# Patient Record
Sex: Male | Born: 1955
Health system: Southern US, Community
[De-identification: ages and names within clinical notes are randomized; demographics above are authoritative.]

## PROBLEM LIST (undated history)

## (undated) DIAGNOSIS — E1149 Type 2 diabetes mellitus with other diabetic neurological complication: Secondary | ICD-10-CM

## (undated) DIAGNOSIS — R002 Palpitations: Secondary | ICD-10-CM

## (undated) DIAGNOSIS — E1142 Type 2 diabetes mellitus with diabetic polyneuropathy: Secondary | ICD-10-CM

## (undated) DIAGNOSIS — I10 Essential (primary) hypertension: Secondary | ICD-10-CM

## (undated) DIAGNOSIS — H4900 Third [oculomotor] nerve palsy, unspecified eye: Secondary | ICD-10-CM

## (undated) HISTORY — DX: Third (oculomotor) nerve palsy, unspecified eye: H49.00

## (undated) HISTORY — PX: OTHER SURGICAL HISTORY: SHX169

## (undated) HISTORY — DX: Type 2 diabetes mellitus with other diabetic neurological complication: E11.49

## (undated) HISTORY — DX: Essential (primary) hypertension: I10

## (undated) HISTORY — DX: Palpitations: R00.2

## (undated) HISTORY — DX: Type 2 diabetes mellitus with diabetic polyneuropathy: E11.42

---

## 1999-04-13 ENCOUNTER — Emergency Department (HOSPITAL_COMMUNITY): Admission: EM | Admit: 1999-04-13 | Discharge: 1999-04-14 | Payer: Self-pay | Admitting: Emergency Medicine

## 1999-04-13 ENCOUNTER — Encounter: Payer: Self-pay | Admitting: Endocrinology

## 2005-11-14 ENCOUNTER — Ambulatory Visit: Payer: Self-pay | Admitting: Internal Medicine

## 2005-11-18 ENCOUNTER — Ambulatory Visit: Payer: Self-pay | Admitting: Internal Medicine

## 2005-11-28 ENCOUNTER — Ambulatory Visit: Payer: Self-pay | Admitting: Internal Medicine

## 2013-02-02 ENCOUNTER — Ambulatory Visit (INDEPENDENT_AMBULATORY_CARE_PROVIDER_SITE_OTHER): Payer: BC Managed Care – PPO | Admitting: Internal Medicine

## 2013-02-02 ENCOUNTER — Encounter: Payer: Self-pay | Admitting: Internal Medicine

## 2013-02-02 VITALS — BP 130/68 | HR 110 | Temp 98.2°F | Wt 171.0 lb

## 2013-02-02 DIAGNOSIS — I10 Essential (primary) hypertension: Secondary | ICD-10-CM

## 2013-02-02 DIAGNOSIS — N400 Enlarged prostate without lower urinary tract symptoms: Secondary | ICD-10-CM | POA: Insufficient documentation

## 2013-02-02 DIAGNOSIS — E1142 Type 2 diabetes mellitus with diabetic polyneuropathy: Secondary | ICD-10-CM

## 2013-02-02 DIAGNOSIS — I152 Hypertension secondary to endocrine disorders: Secondary | ICD-10-CM | POA: Insufficient documentation

## 2013-02-02 DIAGNOSIS — R0989 Other specified symptoms and signs involving the circulatory and respiratory systems: Secondary | ICD-10-CM

## 2013-02-02 DIAGNOSIS — E1149 Type 2 diabetes mellitus with other diabetic neurological complication: Secondary | ICD-10-CM | POA: Insufficient documentation

## 2013-02-02 LAB — CBC WITH DIFFERENTIAL/PLATELET
Basophils Absolute: 0 10*3/uL (ref 0.0–0.1)
Basophils Relative: 0.6 % (ref 0.0–3.0)
Eosinophils Absolute: 0 10*3/uL (ref 0.0–0.7)
Eosinophils Relative: 0.1 % (ref 0.0–5.0)
HCT: 45 % (ref 39.0–52.0)
Hemoglobin: 15.4 g/dL (ref 13.0–17.0)
Lymphocytes Relative: 24.5 % (ref 12.0–46.0)
Lymphs Abs: 2.1 10*3/uL (ref 0.7–4.0)
MCHC: 34.3 g/dL (ref 30.0–36.0)
MCV: 87.4 fl (ref 78.0–100.0)
Monocytes Absolute: 0.5 10*3/uL (ref 0.1–1.0)
Monocytes Relative: 6.3 % (ref 3.0–12.0)
Neutro Abs: 5.9 10*3/uL (ref 1.4–7.7)
Neutrophils Relative %: 68.5 % (ref 43.0–77.0)
Platelets: 306 10*3/uL (ref 150.0–400.0)
RBC: 5.15 Mil/uL (ref 4.22–5.81)
RDW: 11.9 % (ref 11.5–14.6)
WBC: 8.6 10*3/uL (ref 4.5–10.5)

## 2013-02-02 LAB — BASIC METABOLIC PANEL
BUN: 23 mg/dL (ref 6–23)
CO2: 25 mEq/L (ref 19–32)
Calcium: 9.6 mg/dL (ref 8.4–10.5)
Chloride: 95 mEq/L — ABNORMAL LOW (ref 96–112)
Creatinine, Ser: 1.2 mg/dL (ref 0.4–1.5)
GFR: 64.66 mL/min (ref >60.00)
Glucose, Bld: 385 mg/dL — ABNORMAL HIGH (ref 70–99)
Potassium: 4.8 mEq/L (ref 3.5–5.1)
Sodium: 132 mEq/L — ABNORMAL LOW (ref 135–145)

## 2013-02-02 LAB — MICROALBUMIN / CREATININE URINE RATIO
Creatinine,U: 100.8 mg/dL
Microalb, Ur: 21 mg/dL — ABNORMAL HIGH (ref 0.0–1.9)

## 2013-02-02 LAB — HEPATIC FUNCTION PANEL
Alkaline Phosphatase: 97 U/L (ref 39–117)
Bilirubin, Direct: 0.1 mg/dL (ref 0.0–0.3)
Total Bilirubin: 0.6 mg/dL (ref 0.3–1.2)
Total Protein: 7.3 g/dL (ref 6.0–8.3)

## 2013-02-02 LAB — HEMOGLOBIN A1C: Hgb A1c MFr Bld: 12 % — ABNORMAL HIGH (ref 4.6–6.5)

## 2013-02-02 LAB — LIPID PANEL
HDL: 35 mg/dL — ABNORMAL LOW (ref >39.00)
Total CHOL/HDL Ratio: 6
Triglycerides: 556 mg/dL — ABNORMAL HIGH (ref 0.0–149.0)

## 2013-02-02 LAB — PSA: PSA: 0.88 ng/mL (ref 0.10–4.00)

## 2013-02-02 MED ORDER — OLMESARTAN MEDOXOMIL 20 MG PO TABS: 20.0000 mg | ORAL_TABLET | Freq: Every day | ORAL | Status: DC

## 2013-02-02 MED ORDER — PRAVASTATIN SODIUM 40 MG PO TABS: 40.0000 mg | ORAL_TABLET | Freq: Every day | ORAL | Status: DC

## 2013-02-02 MED ORDER — METFORMIN HCL 500 MG PO TABS: 500.0000 mg | ORAL_TABLET | Freq: Two times a day (BID) | ORAL | Status: DC

## 2013-02-02 MED ORDER — INSULIN GLARGINE 100 UNIT/ML ~~LOC~~ SOLN: 30.0000 [IU] | Freq: Every day | SUBCUTANEOUS | Status: DC

## 2013-02-02 MED ORDER — TAMSULOSIN HCL 0.4 MG PO CAPS: 0.4000 mg | ORAL_CAPSULE | Freq: Every day | ORAL | Status: DC

## 2013-02-02 MED ORDER — GLUCOSE BLOOD VI STRP: 1.0000 | ORAL_STRIP | Freq: Every day | Status: DC

## 2013-02-02 NOTE — Progress Notes (Signed)
  Subjective:    Patient ID: Patrick Hernandez, male    DOB: [birth year], 57 y.o.   MRN: [id number]  HPI  57 year old Asian male with type 2 diabetes to establish. Patient previously followed by Dr. Ricki Miller. He has been diabetic for greater than 10 years. He was previously on oral therapy but switched to insulin therapy approximately one year ago. He ran out of Levemir one month ago.  His AM blood sugars range from 130 to 180.  He denies diabetic retinopathy.  He complains of numbness in his feet bilaterally.  His previous PCP mentioned mild proteinuria.  Patient has been on lisinopril 30 mg once daily.  He complains of chronic dry cough.  BP is labile at home.   Review of Systems  Constitutional: Negative for activity change, appetite change and unexpected weight change.  Eyes: Negative for visual disturbance.  Respiratory: Negative for cough, chest tightness and shortness of breath.   Cardiovascular: Negative for chest pain.  Genitourinary:  positive for weak urinary stream.  He denies nocturia Neurological: Negative for headaches.  Gastrointestinal: Negative for abdominal pain Psych: Negative for depression or anxiety Endo:  No polyuria or polydypsia    Past Medical History  Diagnosis Date  . Diabetes mellitus without complication   . Hypertension     History   Social History  . Marital Status: Married    Spouse Name: N/A    Number of Children: N/A  . Years of Education: N/A   Occupational History  . Self employed    Social History Main Topics  . Smoking status: Never Smoker   . Smokeless tobacco: Not on file  . Alcohol Use: No  . Drug Use: No  . Sexually Active: Not on file   Other Topics Concern  . Not on file   Social History Narrative   Married   Self employed - dry cleaning business    History reviewed. No pertinent past surgical history.  Family History  Problem Relation Age of Onset  . Diabetes Mother   . Diabetes Father     Allergies not on file  No  current outpatient prescriptions on file prior to visit.   No current facility-administered medications on file prior to visit.    BP 130/68  Pulse 110  Temp(Src) 98.2 F (36.8 C) (Oral)  Wt 171 lb (77.565 kg)  SpO2 98%        Objective:   Physical Exam  Constitutional: He is oriented to person, place, and time. He appears well-developed and well-nourished.  HENT:  Head: Normocephalic and atraumatic.  Right Ear: External ear normal.  Left Ear: External ear normal.  Mouth/Throat: Oropharynx is clear and moist.  Eyes: Conjunctivae and EOM are normal. Pupils are equal, round, and reactive to light.  Neck: Normal range of motion. Neck supple.  Cardiovascular: Normal rate and regular rhythm.   No murmur heard. Pulmonary/Chest: Effort normal and breath sounds normal. He has no wheezes.  Abdominal: Soft. Bowel sounds are normal. He exhibits no mass. There is no tenderness.  Genitourinary: Guaiac negative stool.  Enlarged prostate without nodules or asymmetry, no rectal mass  Lymphadenopathy:    He has no cervical adenopathy.  Neurological: He is alert and oriented to person, place, and time. No cranial nerve deficit.  Skin: Skin is warm and dry.  Psychiatric: He has a normal mood and affect. His behavior is normal.          Assessment & Plan:

## 2013-02-02 NOTE — Assessment & Plan Note (Signed)
Start tamsulosin for BPH symptoms.

## 2013-02-02 NOTE — Assessment & Plan Note (Addendum)
Patient has uncontrolled diabetes.  Switch levemir to lantus.  Use 30 units at bedtime.  Also restart metformin 500 mg twice daily.  Stressed importance of BP control.  Start pravastatin to reduce CV risk.   Check A1c, microalb/cr ratio.  Patient has symptoms of diabetic polyneuropathy.  He has decreased sensation to temperature and vibration in his lower extremities.

## 2013-02-02 NOTE — Assessment & Plan Note (Signed)
Lisinopril causing cough.  Switch to ARB.  Patient encouraged to monitor BP at home. BP: 130/68 mmHg

## 2013-02-02 NOTE — Assessment & Plan Note (Signed)
Patient has diminished pulses in his feet.  Arrange ABI.  Screen for PAD.

## 2013-02-03 ENCOUNTER — Other Ambulatory Visit: Payer: Self-pay | Admitting: Internal Medicine

## 2013-02-03 DIAGNOSIS — R0989 Other specified symptoms and signs involving the circulatory and respiratory systems: Secondary | ICD-10-CM

## 2013-02-04 ENCOUNTER — Telehealth: Payer: Self-pay | Admitting: Internal Medicine

## 2013-02-04 NOTE — Telephone Encounter (Signed)
Ok to switch to bayer (contour).

## 2013-02-04 NOTE — Telephone Encounter (Signed)
Will give glucometer to pt when he comes in for 2 wk f/u.  Pt's daughter also wanted Dr Artist Pais to know that he has been unable to afford his medicines but now he has insurance and will resume

## 2013-02-04 NOTE — Telephone Encounter (Signed)
[employer] does not cover FREESTYLE LITE test strips. Preferred strips are: Child psychotherapist) or Life Scan (One Touch).  Will only cover Freestyle if patient has: insulin pump, visual impairment, or physical/mental disability that prevent him from using one of the preferred. Please advise. Pt uses [facility] on [address].

## 2013-02-08 ENCOUNTER — Encounter: Payer: Self-pay | Admitting: Internal Medicine

## 2013-02-15 ENCOUNTER — Encounter (INDEPENDENT_AMBULATORY_CARE_PROVIDER_SITE_OTHER): Payer: BC Managed Care – PPO

## 2013-02-15 DIAGNOSIS — R0989 Other specified symptoms and signs involving the circulatory and respiratory systems: Secondary | ICD-10-CM

## 2013-02-15 DIAGNOSIS — I739 Peripheral vascular disease, unspecified: Secondary | ICD-10-CM

## 2013-02-18 ENCOUNTER — Encounter: Payer: Self-pay | Admitting: Internal Medicine

## 2013-02-18 ENCOUNTER — Ambulatory Visit (INDEPENDENT_AMBULATORY_CARE_PROVIDER_SITE_OTHER): Payer: BC Managed Care – PPO | Admitting: Internal Medicine

## 2013-02-18 VITALS — BP 142/82 | HR 106 | Temp 98.2°F | Wt 174.0 lb

## 2013-02-18 DIAGNOSIS — I10 Essential (primary) hypertension: Secondary | ICD-10-CM

## 2013-02-18 DIAGNOSIS — N400 Enlarged prostate without lower urinary tract symptoms: Secondary | ICD-10-CM

## 2013-02-18 DIAGNOSIS — E1149 Type 2 diabetes mellitus with other diabetic neurological complication: Secondary | ICD-10-CM

## 2013-02-18 DIAGNOSIS — R0989 Other specified symptoms and signs involving the circulatory and respiratory systems: Secondary | ICD-10-CM

## 2013-02-18 DIAGNOSIS — E1142 Type 2 diabetes mellitus with diabetic polyneuropathy: Secondary | ICD-10-CM

## 2013-02-18 MED ORDER — METOPROLOL SUCCINATE ER 25 MG PO TB24: 25.0000 mg | ORAL_TABLET | Freq: Every day | ORAL | Status: DC

## 2013-02-18 MED ORDER — GLUCOSE BLOOD VI STRP: 1.0000 | ORAL_STRIP | Freq: Every day | Status: DC

## 2013-02-18 MED ORDER — INSULIN LISPRO 100 UNIT/ML ~~LOC~~ SOLN: SUBCUTANEOUS | Status: DC

## 2013-02-18 NOTE — Patient Instructions (Signed)
Continue to check morning blood sugars.  Also check blood sugar 2 hrs after meals.

## 2013-02-18 NOTE — Assessment & Plan Note (Signed)
Add mealtime insulin-Humalog) 5-10 units 3 times daily. Continue same dose of Lantus.  Reassess in 3 weeks. Refer to ophthalmologist for diabetic eye exam. Lab Results  Component Value Date   HGBA1C 12.0* 02/02/2013

## 2013-02-18 NOTE — Assessment & Plan Note (Signed)
Improved.  Home blood pressure readings reviewed. Systolic blood pressure frequently greater than 140. His heart rate also elevated. Add metoprolol XL 25 mg once daily to Benicar 20 mg.

## 2013-02-18 NOTE — Assessment & Plan Note (Signed)
Lower extremity ABI showed posterior tibial disease bilaterally. Continue medical therapy.

## 2013-02-18 NOTE — Assessment & Plan Note (Signed)
Improved with flomax 

## 2013-02-18 NOTE — Progress Notes (Signed)
  Subjective:    Patient ID: Patrick Hernandez, male    DOB: 11/23/1955, 57 y.o.   MRN: [id number]  HPI  57 year old Asian male with uncontrolled type 2 diabetes for followup. Patient ran out of Levemir one month ago. Patient restarted on Lantus 30 units once daily. Patient also restarted on metformin. His morning blood sugars are still in the 200s.  His postprandial blood sugars are greater than 300.  Patient completed lower extremity ABIs. He has disease of his posterior tibial system.     Review of Systems Negative for chest pain It has been over a year since his last diabetic eye exam  Past Medical History  Diagnosis Date  . Diabetes mellitus without complication   . Hypertension     History   Social History  . Marital Status: Married    Spouse Name: N/A    Number of Children: N/A  . Years of Education: N/A   Occupational History  . Self employed    Social History Main Topics  . Smoking status: Never Smoker   . Smokeless tobacco: Not on file  . Alcohol Use: No  . Drug Use: No  . Sexually Active: Not on file   Other Topics Concern  . Not on file   Social History Narrative   Married   Self employed - dry cleaning business    No past surgical history on file.  Family History  Problem Relation Age of Onset  . Diabetes Mother   . Diabetes Father     Not on File  Current Outpatient Prescriptions on File Prior to Visit  Medication Sig Dispense Refill  . aspirin 81 MG tablet Take 81 mg by mouth daily.      . insulin glargine (LANTUS SOLOSTAR) 100 UNIT/ML injection Inject 0.3 mLs (30 Units total) into the skin at bedtime.  5 pen  5  . metFORMIN (GLUCOPHAGE) 500 MG tablet Take 1 tablet (500 mg total) by mouth 2 (two) times daily with a meal.  180 tablet  1  . olmesartan (BENICAR) 20 MG tablet Take 1 tablet (20 mg total) by mouth daily.  90 tablet  1  . pravastatin (PRAVACHOL) 40 MG tablet Take 1 tablet (40 mg total) by mouth daily.  90 tablet  1  . tamsulosin  (FLOMAX) 0.4 MG CAPS Take 1 capsule (0.4 mg total) by mouth daily after supper.  90 capsule  1   No current facility-administered medications on file prior to visit.    BP 142/82  Pulse 106  Temp(Src) 98.2 F (36.8 C) (Oral)  Wt 174 lb (78.926 kg)       Objective:   Physical Exam  Constitutional: He is oriented to person, place, and time. He appears well-developed and well-nourished.  Cardiovascular: Normal rate, regular rhythm and normal heart sounds.   Pulmonary/Chest: Effort normal and breath sounds normal. He has no wheezes.  Neurological: He is alert and oriented to person, place, and time. No cranial nerve deficit.  Psychiatric: He has a normal mood and affect. His behavior is normal.          Assessment & Plan:

## 2013-02-24 ENCOUNTER — Encounter: Payer: Self-pay | Admitting: Internal Medicine

## 2013-03-02 ENCOUNTER — Ambulatory Visit: Payer: BC Managed Care – PPO | Admitting: Internal Medicine

## 2013-03-16 ENCOUNTER — Telehealth: Payer: Self-pay | Admitting: Internal Medicine

## 2013-03-16 NOTE — Telephone Encounter (Signed)
PT daughter called and stated that when the PT went to pick up his olmesartan (BENICAR) 20 MG tablet, he was informed that his insurance no longer covers that medication. She is inquiring to see if there is anything cheaper that his pharmacy would cover. She also stated that her dad is checking his sugar at least 3 times a day, and his RX for his needles only states once a day. Therefor he is out of his needles and will need another RX with instructions stating that he will be using them more frequent than once a day. Please assist.

## 2013-03-17 MED ORDER — VALSARTAN 160 MG PO TABS: 160.0000 mg | ORAL_TABLET | Freq: Every day | ORAL | Status: DC

## 2013-03-17 MED ORDER — INSULIN PEN NEEDLE 32G X 4 MM MISC: 1.0000 | Freq: Four times a day (QID) | Status: DC

## 2013-03-17 NOTE — Telephone Encounter (Signed)
Please change benicar 20 mg to Diovan 160 mg once daily.  # 90 RF x 1.  Also send in ped needles.  # 120 use 4 x daily as directed  RF x 11

## 2013-03-17 NOTE — Telephone Encounter (Signed)
rx's sent in electronically, pts daughter aware

## 2013-03-22 ENCOUNTER — Telehealth: Payer: Self-pay | Admitting: Internal Medicine

## 2013-03-22 MED ORDER — LOSARTAN POTASSIUM 100 MG PO TABS: 100.0000 mg | ORAL_TABLET | Freq: Every day | ORAL | Status: DC

## 2013-03-22 MED ORDER — GLUCOSE BLOOD VI STRP: 1.0000 | ORAL_STRIP | Freq: Three times a day (TID) | Status: DC

## 2013-03-22 NOTE — Telephone Encounter (Signed)
Due to change in pt's testing pt needs new script for : glucose blood (BAYER CONTOUR NEXT TEST) test strip Pharm/ [facility]/[address]

## 2013-03-22 NOTE — Telephone Encounter (Signed)
Dr Artist Pais - Benicar and Diovan both denied by insurance. They want him to start out on the lowest med in this class first, like losartan. Is this appropriate? If so, please send to [facility].

## 2013-03-22 NOTE — Telephone Encounter (Signed)
rx's sent in electronically 

## 2013-03-22 NOTE — Telephone Encounter (Signed)
Ok to change to losartan 100 mg # 90 one po qd.  RF x 1

## 2013-05-02 ENCOUNTER — Other Ambulatory Visit: Payer: Self-pay | Admitting: Internal Medicine

## 2013-05-17 ENCOUNTER — Encounter: Payer: Self-pay | Admitting: Internal Medicine

## 2013-07-04 ENCOUNTER — Other Ambulatory Visit: Payer: Self-pay | Admitting: Internal Medicine

## 2013-07-22 ENCOUNTER — Other Ambulatory Visit: Payer: Self-pay | Admitting: Internal Medicine

## 2013-08-06 ENCOUNTER — Other Ambulatory Visit: Payer: Self-pay | Admitting: Internal Medicine

## 2013-08-19 ENCOUNTER — Other Ambulatory Visit: Payer: Self-pay | Admitting: Internal Medicine

## 2013-09-24 ENCOUNTER — Other Ambulatory Visit: Payer: Self-pay | Admitting: Internal Medicine

## 2013-10-12 ENCOUNTER — Ambulatory Visit (INDEPENDENT_AMBULATORY_CARE_PROVIDER_SITE_OTHER): Payer: BC Managed Care – PPO | Admitting: Internal Medicine

## 2013-10-12 ENCOUNTER — Encounter: Payer: Self-pay | Admitting: Internal Medicine

## 2013-10-12 VITALS — BP 170/92 | HR 80 | Temp 98.2°F | Ht 69.75 in | Wt 198.0 lb

## 2013-10-12 DIAGNOSIS — R002 Palpitations: Secondary | ICD-10-CM

## 2013-10-12 DIAGNOSIS — E1149 Type 2 diabetes mellitus with other diabetic neurological complication: Secondary | ICD-10-CM

## 2013-10-12 DIAGNOSIS — I10 Essential (primary) hypertension: Secondary | ICD-10-CM

## 2013-10-12 DIAGNOSIS — E1142 Type 2 diabetes mellitus with diabetic polyneuropathy: Secondary | ICD-10-CM

## 2013-10-12 DIAGNOSIS — R079 Chest pain, unspecified: Secondary | ICD-10-CM

## 2013-10-12 MED ORDER — ATORVASTATIN CALCIUM 40 MG PO TABS: 40.0000 mg | ORAL_TABLET | Freq: Every day | ORAL | Status: DC

## 2013-10-12 MED ORDER — INSULIN LISPRO 100 UNIT/ML ~~LOC~~ SOLN: SUBCUTANEOUS | Status: DC

## 2013-10-12 MED ORDER — AMLODIPINE BESYLATE 5 MG PO TABS: 5.0000 mg | ORAL_TABLET | Freq: Every day | ORAL | Status: DC

## 2013-10-12 MED ORDER — METOPROLOL SUCCINATE ER 50 MG PO TB24: 50.0000 mg | ORAL_TABLET | Freq: Every day | ORAL | Status: DC

## 2013-10-12 NOTE — Assessment & Plan Note (Signed)
Patient experiencing hypoglycemic episodes. Reduce mealtime insulin 8 units before meal. Continue same dose of metformin. Monitor A1c.

## 2013-10-12 NOTE — Patient Instructions (Signed)
Our office will contact you re: cardiology referral Go to the emergency room if your chest pain gets worse.

## 2013-10-12 NOTE — Progress Notes (Signed)
Subjective:    Patient ID: Patrick Hernandez, male    DOB: [birth year], 57 y.o.   MRN: [id number]  HPI  57 year old [place] male with uncontrolled type 2 diabetes and hypertension for followup. Patient reports over last 2 months patient has experienced intermittent palpitations and mild substernal chest discomfort. Patient also reports associated mild dizziness and shortness of breath with palpitations. Symptoms only last several minutes. He has been compliant with his blood pressure medications. He takes losartan 100 mg once daily and Toprol 25 mg once daily. Patient works as a Retail banker (dry cleaning business). He denies any exertional chest pain.  No family history of coronary artery disease.  Uncontrolled type 2 diabetes-he is taking Lantus 30 units in the morning and 15 units before breakfast and dinner. He has intermittent hypoglycemic episodes especially in the afternoon.  Hyperlipidemia-good compliance with pravastatin   Review of Systems Palpitations/skipped beats, mild chest discomfort, no radiation of discomfort, Negative for exercise intolerance    Past Medical History  Diagnosis Date  . Diabetes mellitus without complication   . Hypertension     History   Social History  . Marital Status: Married    Spouse Name: N/A    Number of Children: N/A  . Years of Education: N/A   Occupational History  . Self employed    Social History Main Topics  . Smoking status: Never Smoker   . Smokeless tobacco: Not on file  . Alcohol Use: No  . Drug Use: No  . Sexual Activity: Not on file   Other Topics Concern  . Not on file   Social History Narrative   Married   Self employed - dry cleaning business    No past surgical history on file.  Family History  Problem Relation Age of Onset  . Diabetes Mother   . Diabetes Father     Not on File  Current Outpatient Prescriptions on File Prior to Visit  Medication Sig Dispense Refill  . aspirin 81 MG tablet Take 81  mg by mouth daily.      Marland Kitchen BAYER CONTOUR NEXT TEST test strip 1 EACH BY OTHER ROUTE 3 (THREE) TIMES DAILY. USE AS INSTRUCTED  100 each  2  . insulin glargine (LANTUS SOLOSTAR) 100 UNIT/ML injection Inject 0.3 mLs (30 Units total) into the skin at bedtime.  5 pen  5  . insulin lispro (HUMALOG KWIKPEN) 100 UNIT/ML injection Use 5 to 10 units 15 minutes before meals 3 times daily  15 mL  5  . Insulin Pen Needle (BD PEN NEEDLE NANO U/F) 32G X 4 MM MISC 1 each by Does not apply route 4 (four) times daily.  150 each  11  . losartan (COZAAR) 100 MG tablet TAKE 1 TABLET (100 MG TOTAL) BY MOUTH DAILY.  90 tablet  0  . metFORMIN (GLUCOPHAGE) 500 MG tablet TAKE 1 TABLET (500 MG TOTAL) BY MOUTH 2 TIMES DAILY WITH A MEAL.  60 tablet  0  . metoprolol succinate (TOPROL-XL) 25 MG 24 hr tablet TAKE 1 TABLET (25 MG TOTAL) BY MOUTH DAILY.  30 tablet  0  . metoprolol succinate (TOPROL-XL) 25 MG 24 hr tablet TAKE 1 TABLET (25 MG TOTAL) BY MOUTH DAILY.  30 tablet  0  . pravastatin (PRAVACHOL) 40 MG tablet TAKE 1 TABLET (40 MG TOTAL) BY MOUTH DAILY.  90 tablet  1  . tamsulosin (FLOMAX) 0.4 MG CAPS capsule TAKE 1 CAPSULE (0.4 MG TOTAL) BY MOUTH DAILY AFTER SUPPER.  90 capsule  1   No current facility-administered medications on file prior to visit.    BP 170/92  Pulse 80  Temp(Src) 98.2 F (36.8 C) (Oral)  Ht 5' 9.75" (1.772 m)  Wt 198 lb (89.812 kg)  BMI 28.60 kg/m2  EKG shows normal sinus rhythm at 82 beats per minute. No ST changes noted  Objective:   Physical Exam  Constitutional: He is oriented to person, place, and time. He appears well-developed and well-nourished. No distress.  HENT:  Head: Normocephalic and atraumatic.  Right Ear: External ear normal.  Left Ear: External ear normal.  Eyes: EOM are normal. Pupils are equal, round, and reactive to light.  Neck: Neck supple.  No carotid bruit  Cardiovascular: Normal rate, regular rhythm, normal heart sounds and intact distal pulses.   No murmur  heard. Pulmonary/Chest: Effort normal and breath sounds normal. He has no wheezes. He has no rales.  Musculoskeletal: He exhibits no edema.  Lymphadenopathy:    He has no cervical adenopathy.  Neurological: He is alert and oriented to person, place, and time.  Skin: Skin is warm and dry.  Psychiatric: He has a normal mood and affect. His behavior is normal.          Assessment & Plan:

## 2013-10-12 NOTE — Assessment & Plan Note (Signed)
Blood pressure is poorly controlled. Continue same dose of losartan 100 mg. Increase metoprolol succinate 250 mg once daily and add amlodipine 5 mg. BP: 170/92 mmHg

## 2013-10-12 NOTE — Progress Notes (Signed)
Pre visit review using our clinic review tool, if applicable. No additional management support is needed unless otherwise documented below in the visit note. 

## 2013-10-12 NOTE — Assessment & Plan Note (Addendum)
57 year old [place] male with intermittent palpitations and chest discomfort. Patient has multiple risk factors for coronary disease. EKG today is normal. Refer to cardiology for further testing. Increase metoprolol xl to 50 mg.  Add amlodipine 5 mg to losartan 100 mg.  Discontinue pravastatin. Switch to atorvastatin 40 mg once daily.  Obtain BMET, CBCD, and thyroid studies.

## 2013-10-13 LAB — BASIC METABOLIC PANEL
CO2: 18 mEq/L — ABNORMAL LOW (ref 19–32)
Calcium: 9 mg/dL (ref 8.4–10.5)
Chloride: 106 mEq/L (ref 96–112)
Sodium: 138 mEq/L (ref 135–145)

## 2013-10-13 LAB — CBC WITH DIFFERENTIAL/PLATELET
Basophils Absolute: 0 10*3/uL (ref 0.0–0.1)
Eosinophils Relative: 0.5 % (ref 0.0–5.0)
Hemoglobin: 12.9 g/dL — ABNORMAL LOW (ref 13.0–17.0)
Lymphocytes Relative: 19.9 % (ref 12.0–46.0)
Monocytes Relative: 5.4 % (ref 3.0–12.0)
Neutro Abs: 6.9 10*3/uL (ref 1.4–7.7)
Neutrophils Relative %: 73.9 % (ref 43.0–77.0)
Platelets: 276 10*3/uL (ref 150.0–400.0)
RBC: 4.17 Mil/uL — ABNORMAL LOW (ref 4.22–5.81)
RDW: 11.8 % (ref 11.5–14.6)

## 2013-10-13 LAB — T4, FREE: Free T4: 0.88 ng/dL (ref 0.60–1.60)

## 2013-10-13 LAB — HEMOGLOBIN A1C: Hgb A1c MFr Bld: 7.9 % — ABNORMAL HIGH (ref 4.6–6.5)

## 2013-10-13 LAB — TSH: TSH: 1.31 u[IU]/mL (ref 0.35–5.50)

## 2013-10-14 ENCOUNTER — Other Ambulatory Visit: Payer: Self-pay | Admitting: Internal Medicine

## 2013-10-18 ENCOUNTER — Other Ambulatory Visit (INDEPENDENT_AMBULATORY_CARE_PROVIDER_SITE_OTHER): Payer: BC Managed Care – PPO

## 2013-10-18 ENCOUNTER — Other Ambulatory Visit: Payer: Self-pay | Admitting: *Deleted

## 2013-10-18 DIAGNOSIS — D649 Anemia, unspecified: Secondary | ICD-10-CM

## 2013-10-18 DIAGNOSIS — N289 Disorder of kidney and ureter, unspecified: Secondary | ICD-10-CM

## 2013-10-18 LAB — IRON: Iron: 58 ug/dL (ref 42–165)

## 2013-10-18 LAB — IBC PANEL
Iron: 58 ug/dL (ref 42–165)
Saturation Ratios: 21.9 % (ref 20.0–50.0)
Transferrin: 189.6 mg/dL — ABNORMAL LOW (ref 212.0–360.0)

## 2013-10-18 LAB — VITAMIN B12: Vitamin B-12: 348 pg/mL (ref 211–911)

## 2013-10-18 LAB — FERRITIN: Ferritin: 151.5 ng/mL (ref 22.0–322.0)

## 2013-10-18 NOTE — Addendum Note (Signed)
Addended by: Rita Ohara R on: 10/18/2013 04:03 PM   Modules accepted: Orders

## 2013-10-19 ENCOUNTER — Other Ambulatory Visit: Payer: Self-pay | Admitting: *Deleted

## 2013-10-19 ENCOUNTER — Ambulatory Visit
Admission: RE | Admit: 2013-10-19 | Discharge: 2013-10-19 | Disposition: A | Discharge status: Home / Self Care | Payer: BC Managed Care – PPO | Source: Ambulatory Visit | Attending: Internal Medicine | Admitting: Internal Medicine

## 2013-10-19 DIAGNOSIS — N289 Disorder of kidney and ureter, unspecified: Secondary | ICD-10-CM

## 2013-10-19 LAB — URINALYSIS, MICROSCOPIC ONLY

## 2013-10-21 ENCOUNTER — Other Ambulatory Visit: Payer: BC Managed Care – PPO

## 2013-10-21 ENCOUNTER — Encounter: Payer: Self-pay | Admitting: Cardiology

## 2013-10-21 ENCOUNTER — Ambulatory Visit (INDEPENDENT_AMBULATORY_CARE_PROVIDER_SITE_OTHER): Payer: BC Managed Care – PPO | Admitting: Cardiology

## 2013-10-21 DIAGNOSIS — R002 Palpitations: Secondary | ICD-10-CM

## 2013-10-21 DIAGNOSIS — R079 Chest pain, unspecified: Secondary | ICD-10-CM

## 2013-10-21 NOTE — Patient Instructions (Signed)
The current medical regimen is effective;  continue present plan and medications.  Your physician has recommended that you wear a holter monitor for 48 hours.This will be placed the same day as your treadmill test.  Holter monitors are medical devices that record the heart's electrical activity. Doctors most often use these monitors to diagnose arrhythmias. Arrhythmias are problems with the speed or rhythm of the heartbeat. The monitor is a small, portable device. You can wear one while you do your normal daily activities. This is usually used to diagnose what is causing palpitations/syncope (passing out).  Your physician has requested that you have an exercise tolerance test. For further information please visit [email or web address] Please also follow instruction sheet, as given.  Follow up with Dr Antoine Poche after testing.

## 2013-10-21 NOTE — Progress Notes (Signed)
HPI The patient presents for evaluation of chest discomfort and abnormal heartbeat. The history is obtained through his daughter who interpreted [place] on the telephone for [place].   He has no prior cardiac history. He says for about 6 months he's been getting some episodes of his heart beating slower. He might have a vague chest discomfort with this. He says sometimes it bleeds a little fast to. He doesn't pass out but he might get a little weak. This apparently happens sporadically and at rest. He cannot bring it on with activities. He doesn't exercise but he does work physical job and with this he does not bring on any symptoms he does not describe discomfort in his jaw or his arms. He does not describe any excessive shortness of breath, PND or orthopnea. He has had no weight gain or edema. Of note he has had long-standing diabetes poorly controlled until recently.  No Known Allergies  Current Outpatient Prescriptions  Medication Sig Dispense Refill  . amLODipine (NORVASC) 5 MG tablet Take 1 tablet (5 mg total) by mouth daily.  30 tablet  1  . aspirin 81 MG tablet Take 81 mg by mouth daily.      Marland Kitchen atorvastatin (LIPITOR) 40 MG tablet Take 1 tablet (40 mg total) by mouth daily.  90 tablet  1  . BAYER CONTOUR NEXT TEST test strip 1 EACH BY OTHER ROUTE 3 (THREE) TIMES DAILY. USE AS INSTRUCTED  100 each  2  . insulin glargine (LANTUS SOLOSTAR) 100 UNIT/ML injection Inject 0.3 mLs (30 Units total) into the skin at bedtime.  5 pen  5  . insulin lispro (HUMALOG) 100 UNIT/ML injection Use 8 units before breakfast and dinner  15 mL  5  . Insulin Pen Needle (BD PEN NEEDLE NANO U/F) 32G X 4 MM MISC 1 each by Does not apply route 4 (four) times daily.  150 each  11  . losartan (COZAAR) 100 MG tablet TAKE 1 TABLET (100 MG TOTAL) BY MOUTH DAILY.  90 tablet  0  . metFORMIN (GLUCOPHAGE) 500 MG tablet TAKE 1 TABLET (500 MG TOTAL) BY MOUTH 2 TIMES DAILY WITH A MEAL.  60 tablet  0  . metoprolol succinate (TOPROL-XL)  50 MG 24 hr tablet Take 1 tablet (50 mg total) by mouth daily.  90 tablet  1  . tamsulosin (FLOMAX) 0.4 MG CAPS capsule TAKE 1 CAPSULE (0.4 MG TOTAL) BY MOUTH DAILY AFTER SUPPER.  90 capsule  1   No current facility-administered medications for this visit.    Past Medical History  Diagnosis Date  . Diabetes mellitus without complication   . Hypertension   . Chest pain, unspecified   . Palpitations   . Type II or unspecified type diabetes mellitus with neurological manifestations, not stated as uncontrolled(250.60)   . Polyneuropathy in diabetes(357.2)   . Unspecified essential hypertension     No past surgical history on file.  Family History  Problem Relation Age of Onset  . Diabetes Mother   . Diabetes Father     History   Social History  . Marital Status: Married    Spouse Name: N/A    Number of Children: N/A  . Years of Education: N/A   Occupational History  . Self employed    Social History Main Topics  . Smoking status: Never Smoker   . Smokeless tobacco: Not on file  . Alcohol Use: No  . Drug Use: No  . Sexual Activity: Not on file  Other Topics Concern  . Not on file   Social History Narrative   Married   Self employed - dry cleaning business    ROS:  As stated in the HPI and negative for all other systems.   PHYSICAL EXAM There were no vitals taken for this visit. GENERAL:  Well appearing HEENT:  Pupils equal round and reactive, fundi not visualized, oral mucosa unremarkable NECK:  No jugular venous distention, waveform within normal limits, carotid upstroke brisk and symmetric, no bruits, no thyromegaly LYMPHATICS:  No cervical, inguinal adenopathy LUNGS:  Clear to auscultation bilaterally BACK:  No CVA tenderness CHEST:  Unremarkable HEART:  PMI not displaced or sustained,S1 and S2 within normal limits, no S3, no S4, no clicks, no rubs, no murmurs ABD:  Flat, positive bowel sounds normal in frequency in pitch, no bruits, no rebound, no  guarding, no midline pulsatile mass, no hepatomegaly, no splenomegaly EXT:  2 plus pulses throughout, no edema, no cyanosis no clubbing SKIN:  No rashes no nodules NEURO:  Cranial nerves II through XII grossly intact, motor grossly intact throughout PSYCH:  Cognitively intact, oriented to person place and time  EKG:  Sinus rhythm, rate 82, axis within normal limits, intervals within normal limits, no acute ST-T wave changes.   ASSESSMENT AND PLAN  CHEST PAIN:  The pain  He is vague and somewhat atypical. However, he has significant risk factors with his long-standing diabetes. I will bring the patient back for a POET (Plain Old Exercise Test). This will allow me to screen for obstructive coronary disease, risk stratify and very importantly provide a prescription for exercise.   I did talk with him about the need to let me know if he has continued symptoms even if this is normal as I would have a low threshold for further testing.  PALPITATIONS:   I will start with 48 hour monitor to further evaluate this.

## 2013-10-24 ENCOUNTER — Other Ambulatory Visit: Payer: BC Managed Care – PPO

## 2013-10-24 LAB — FECAL OCCULT BLOOD, IMMUNOCHEMICAL: Fecal Occult Bld: NEGATIVE

## 2013-10-25 ENCOUNTER — Other Ambulatory Visit (INDEPENDENT_AMBULATORY_CARE_PROVIDER_SITE_OTHER): Payer: BC Managed Care – PPO

## 2013-10-25 DIAGNOSIS — N289 Disorder of kidney and ureter, unspecified: Secondary | ICD-10-CM

## 2013-10-26 LAB — BASIC METABOLIC PANEL
BUN: 25 mg/dL — ABNORMAL HIGH (ref 6–23)
Calcium: 8.7 mg/dL (ref 8.4–10.5)
Creatinine, Ser: 1.6 mg/dL — ABNORMAL HIGH (ref 0.4–1.5)
GFR: 49.02 mL/min — ABNORMAL LOW (ref >60.00)
Glucose, Bld: 108 mg/dL — ABNORMAL HIGH (ref 70–99)

## 2013-10-28 ENCOUNTER — Other Ambulatory Visit: Payer: Self-pay | Admitting: Internal Medicine

## 2013-10-28 DIAGNOSIS — I10 Essential (primary) hypertension: Secondary | ICD-10-CM

## 2013-11-02 ENCOUNTER — Encounter: Payer: Self-pay | Admitting: Cardiology

## 2013-11-02 ENCOUNTER — Ambulatory Visit (HOSPITAL_COMMUNITY): Payer: BC Managed Care – PPO | Attending: Internal Medicine

## 2013-11-02 DIAGNOSIS — I10 Essential (primary) hypertension: Secondary | ICD-10-CM | POA: Insufficient documentation

## 2013-11-02 DIAGNOSIS — E119 Type 2 diabetes mellitus without complications: Secondary | ICD-10-CM | POA: Insufficient documentation

## 2013-11-02 DIAGNOSIS — E785 Hyperlipidemia, unspecified: Secondary | ICD-10-CM | POA: Insufficient documentation

## 2013-11-05 ENCOUNTER — Other Ambulatory Visit: Payer: Self-pay | Admitting: Internal Medicine

## 2013-11-09 ENCOUNTER — Ambulatory Visit (INDEPENDENT_AMBULATORY_CARE_PROVIDER_SITE_OTHER): Payer: BC Managed Care – PPO | Admitting: *Deleted

## 2013-11-09 ENCOUNTER — Ambulatory Visit (HOSPITAL_COMMUNITY)
Admission: RE | Admit: 2013-11-09 | Discharge: 2013-11-09 | Disposition: A | Discharge status: Home / Self Care | Payer: BC Managed Care – PPO | Source: Ambulatory Visit | Attending: Internal Medicine | Admitting: Internal Medicine

## 2013-11-09 DIAGNOSIS — R002 Palpitations: Secondary | ICD-10-CM

## 2013-11-09 DIAGNOSIS — R079 Chest pain, unspecified: Secondary | ICD-10-CM | POA: Insufficient documentation

## 2013-11-09 NOTE — Progress Notes (Signed)
Pt in for 48-hr Holter Monitor.  Monitor hooked up and instructions given w/ extra electrodes.  Pt to return monitor on Friday morning in bag supplied, labeled w/ pt's name.  Pt/relative verbalized understanding and agreed w/ plan.  Pt discharged w/ supplies and color chart for placement.

## 2013-11-21 ENCOUNTER — Telehealth: Payer: Self-pay | Admitting: Internal Medicine

## 2013-11-21 NOTE — Telephone Encounter (Signed)
Left message for Patrick MossesDiana - stress test looked OK.  I do not have the results of the monitor and will have to call the [place] office to get them.  Aware I will call back with results as soon as I have them in hand.

## 2013-11-21 NOTE — Telephone Encounter (Signed)
This is a Dr. Antoine PocheHochrein patient.  [facility]

## 2013-11-21 NOTE — Telephone Encounter (Signed)
Wanted to get results of his tests.  Please call

## 2013-11-24 NOTE — Telephone Encounter (Signed)
Left message of results of monitor (NSR) on voicemail and requested them to call back with any questions or concerns.

## 2013-11-29 ENCOUNTER — Encounter: Payer: Self-pay | Admitting: Cardiovascular Disease

## 2013-12-05 ENCOUNTER — Other Ambulatory Visit: Payer: Self-pay | Admitting: Internal Medicine

## 2013-12-07 ENCOUNTER — Telehealth: Payer: Self-pay | Admitting: Internal Medicine

## 2013-12-07 NOTE — Telephone Encounter (Signed)
I don't see this documented, please advise

## 2013-12-07 NOTE — Telephone Encounter (Signed)
Ok for Rx for Bystolic 5 mg.  He would take this instead of metoprolol.  Please adjust he medication list.

## 2013-12-07 NOTE — Telephone Encounter (Signed)
appt scheduled

## 2013-12-07 NOTE — Telephone Encounter (Signed)
Pt given samples of bystolic 5mg . [facility] [address]

## 2013-12-07 NOTE — Telephone Encounter (Signed)
Patient Information:  Caller Name: Bernita BuffyDianna  Phone: [phone]  Patient: Patrick Hernandez, Patrick Hernandez  Gender: Male  DOB: [birth year]  Age: 58 Years  PCP: Artist PaisYoo, Doe-Azarel Molly Maduro(Robert) (Adults only)  Office Follow Up:  Does the office need to follow up with this patient?: No  Instructions For The Office: N/A  RN Note:  [facility] Nothing today for Dr. Artist PaisYoo. Pt prefers to see Dr. Artist PaisYoo; will wait until next week at first opening-12/14/13. Strict call back parameters given.  Symptoms  Reason For Call & Symptoms: Daughter calling regarding pt's c/o chest pain; has been dx as heart burn/reflux. Not feeling that now. Had recent cardiology testing; all wnl. Pt still having some discomfort that comes and goes. Has finished samples of reflux meds that did help. Usually feels discomfort at night and after lying down. Denies any SOB or pain now. Wants to be seen. Pt doesn't speak English well, that is why daughter is calling.  Reviewed Health History In EMR: Yes  Reviewed Medications In EMR: Yes  Reviewed Allergies In EMR: Yes  Reviewed Surgeries / Procedures: Yes  Date of Onset of Symptoms: 10/05/2013  Treatments Tried: Zantac  Treatments Tried Worked: Yes  Guideline(s) Used:  Chest Pain  Disposition Per Guideline:   See Today in Office  Reason For Disposition Reached:   Intermittent chest pains persist > 3 days  Advice Given:  N/A  Patient Will Follow Care Advice:  YES

## 2013-12-08 MED ORDER — NEBIVOLOL HCL 5 MG PO TABS: 5.0000 mg | ORAL_TABLET | Freq: Every day | ORAL | Status: DC

## 2013-12-08 NOTE — Telephone Encounter (Signed)
Called and left message Sinclair GroomsDiana Reckner's voicemail with Dr Olegario MessierYoo's instructions.  Also called pharmacy and cancelled refills on metoprolol and asked them to make sure pt understands to d/c the metoprolol

## 2013-12-10 ENCOUNTER — Other Ambulatory Visit: Payer: Self-pay | Admitting: Internal Medicine

## 2013-12-12 ENCOUNTER — Telehealth: Payer: Self-pay | Admitting: Internal Medicine

## 2013-12-12 MED ORDER — INSULIN ASPART 100 UNIT/ML CARTRIDGE (PENFILL): 8.0000 [IU] | Freq: Two times a day (BID) | SUBCUTANEOUS | Status: DC

## 2013-12-12 NOTE — Telephone Encounter (Signed)
[facility] Humalog isn't the preferred medication under pt's plan. Novolog is the preferred medication.  Please advise if you would like for me to continue with PA or switch pt to Novolog.

## 2013-12-12 NOTE — Telephone Encounter (Signed)
Ok for switch to [employer].

## 2013-12-12 NOTE — Telephone Encounter (Signed)
rx sent in electronically, please disregard the PA

## 2013-12-14 ENCOUNTER — Ambulatory Visit: Payer: BC Managed Care – PPO | Admitting: Internal Medicine

## 2013-12-16 ENCOUNTER — Ambulatory Visit (INDEPENDENT_AMBULATORY_CARE_PROVIDER_SITE_OTHER): Payer: BC Managed Care – PPO | Admitting: Internal Medicine

## 2013-12-16 ENCOUNTER — Encounter: Payer: Self-pay | Admitting: Internal Medicine

## 2013-12-16 VITALS — BP 146/82 | Temp 98.0°F | Ht 69.75 in | Wt 195.0 lb

## 2013-12-16 DIAGNOSIS — R079 Chest pain, unspecified: Secondary | ICD-10-CM

## 2013-12-16 DIAGNOSIS — I10 Essential (primary) hypertension: Secondary | ICD-10-CM

## 2013-12-16 DIAGNOSIS — E1149 Type 2 diabetes mellitus with other diabetic neurological complication: Secondary | ICD-10-CM

## 2013-12-16 DIAGNOSIS — K219 Gastro-esophageal reflux disease without esophagitis: Secondary | ICD-10-CM

## 2013-12-16 MED ORDER — METOPROLOL SUCCINATE ER 50 MG PO TB24: 50.0000 mg | ORAL_TABLET | Freq: Every day | ORAL | Status: DC

## 2013-12-16 MED ORDER — IRBESARTAN 150 MG PO TABS: 150.0000 mg | ORAL_TABLET | Freq: Every day | ORAL | Status: DC

## 2013-12-16 MED ORDER — INSULIN GLARGINE 100 UNIT/ML SOLOSTAR PEN: 30.0000 [IU] | PEN_INJECTOR | Freq: Every day | SUBCUTANEOUS | Status: DC

## 2013-12-16 MED ORDER — OMEPRAZOLE 40 MG PO CPDR: DELAYED_RELEASE_CAPSULE | ORAL | Status: DC

## 2013-12-16 MED ORDER — INSULIN ASPART 100 UNIT/ML CARTRIDGE (PENFILL): 15.0000 [IU] | Freq: Two times a day (BID) | SUBCUTANEOUS | Status: DC

## 2013-12-16 NOTE — Assessment & Plan Note (Addendum)
Patient self titrated NovoLog to 15 units before larger meals. His blood sugars are improved. Metformin discontinued due to rising serum creatinine.  We discussed decreasing his carbohydrate intake as well as insulin use as to further avoid weight gain. Consider adding Victoza.

## 2013-12-16 NOTE — Progress Notes (Signed)
Subjective:    Patient ID: Patrick Hernandez, male    DOB: Mar 31, 1956, 58 y.o.   MRN: [id number]  HPI  58 year old Asian male with uncontrolled type 2 diabetes with neurologic manifestations, hypertension and atypical chest pain for followup. Patient seen by cardiology. He completed exercise stress test - negative.  Patient continues to have intermittent symptoms. His symptoms occasionally occur during the evening and he reports resolution of symptoms after taking Zantac 150 mg. He denies any issues with dysphasia.  Palpitations-no significant change. Blood pressure better with Bystolic but medication is cost prohibitive. 48 hour Holter monitor negative for malignant arrhythmia.  Type 2 diabetes-patient has been taking higher dose of NovoLog 15 units before meals. His postprandial blood sugars are much improved. They are usually between 130 and160.  Renal insufficiency-renal artery Doppler was negative for renal artery stenosis.  Renal US also normal.  Patient accompanied by [place] interpretor.  Review of Systems Frequent heartburn, negative for shortness of breath.  Weight trending upward.    Past Medical History  Diagnosis Date  . Hypertension   . Palpitations   . Type II or unspecified type diabetes mellitus with neurological manifestations, not stated as uncontrolled   . Polyneuropathy in diabetes(357.2)     History   Social History  . Marital Status: Married    Spouse Name: N/A    Number of Children: N/A  . Years of Education: N/A   Occupational History  . Self employed    Social History Main Topics  . Smoking status: Never Smoker   . Smokeless tobacco: Not on file  . Alcohol Use: No  . Drug Use: No  . Sexual Activity: Not on file   Other Topics Concern  . 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 None      Family History  Problem Relation Age of Onset  .  Diabetes Mother   . Diabetes Father   . Heart disease Father     Pacemaker    No Known Allergies  Current Outpatient Prescriptions on File Prior to Visit  Medication Sig Dispense Refill  . aspirin 81 MG tablet Take 81 mg by mouth daily.      Marland Kitchen. atorvastatin (LIPITOR) 40 MG tablet Take 1 tablet (40 mg total) by mouth daily.  90 tablet  1  . BAYER CONTOUR NEXT TEST test strip 1 EACH BY OTHER ROUTE 3 (THREE) TIMES DAILY. USE AS INSTRUCTED  100 each  2  . Insulin Pen Needle (BD PEN NEEDLE NANO U/F) 32G X 4 MM MISC 1 each by Does not apply route 4 (four) times daily.  150 each  11  . tamsulosin (FLOMAX) 0.4 MG CAPS capsule TAKE 1 CAPSULE (0.4 MG TOTAL) BY MOUTH DAILY AFTER SUPPER.  30 capsule  0  . amLODipine (NORVASC) 5 MG tablet TAKE 1 TABLET (5 MG TOTAL) BY MOUTH DAILY.  30 tablet  5   No current facility-administered medications on file prior to visit.    BP 146/82  Temp(Src) 98 F (36.7 C) (Oral)  Ht 5' 9.75" (1.772 m)  Wt 195 lb (88.451 kg)  BMI 28.17 kg/m2    Objective:   Physical Exam  Constitutional: He is oriented to person, place, and time. He appears well-developed and well-nourished. No distress.  HENT:  Head: Normocephalic and atraumatic.  Eyes: EOM are normal. Pupils are equal,  round, and reactive to light.  Neck: Neck supple.  No carotid bruit  Cardiovascular: Normal rate, regular rhythm and normal heart sounds.   No murmur heard. Pulmonary/Chest: Effort normal and breath sounds normal. He has no wheezes.  Abdominal: Soft. Bowel sounds are normal. There is no tenderness.  Musculoskeletal:  Trace lower extremity edema bilaterally  Neurological: He is alert and oriented to person, place, and time. No cranial nerve deficit.  Psychiatric: He has a normal mood and affect. His behavior is normal.          Assessment & Plan:

## 2013-12-16 NOTE — Assessment & Plan Note (Signed)
Exercise stress test and 48 holter monitor were negative.  His symptoms may be secondary to untreated reflux.

## 2013-12-16 NOTE — Assessment & Plan Note (Signed)
Patient's atypical chest pain may be secondary to reflux. Start omeprazole 20 mg once daily 15 minutes before am meal. Check H. Pylori antibody

## 2013-12-16 NOTE — Assessment & Plan Note (Signed)
Blood pressure is improved but Bystolic is cost prohibitive. Discontinue Bystolic. Changed to metoprolol 50 mg once daily which may also help with his palpitations.  Replace losartan with irbesartan.

## 2013-12-16 NOTE — Progress Notes (Signed)
Pre visit review using our clinic review tool, if applicable. No additional management support is needed unless otherwise documented below in the visit note. 

## 2013-12-16 NOTE — Patient Instructions (Signed)
Please complete the following lab tests before your next follow up appointment: BMET - 401.9 

## 2013-12-19 ENCOUNTER — Telehealth: Payer: Self-pay | Admitting: Internal Medicine

## 2013-12-19 NOTE — Telephone Encounter (Signed)
Relevant patient education mailed to patient.  

## 2014-01-06 ENCOUNTER — Other Ambulatory Visit: Payer: Self-pay | Admitting: Internal Medicine

## 2014-01-19 ENCOUNTER — Telehealth: Payer: Self-pay | Admitting: Internal Medicine

## 2014-01-19 NOTE — Telephone Encounter (Signed)
Patient's daughter came in and wanted to inform us that the BP medication that her father is on is not working. This is the note that she left: Metoprolol and Irbesartan not working well. BP around 158/82. Old BP meds (Losartant and Bystolic) works better. New heartburn Omeprazole working but symptoms still persist in the morning 1x - 2x day/week. Stopped by to give message. Erlene SentersDayena Lapka [phone].  I advised patient's daughter that a DPR wasn't on file allowing access of any information to her and she stated that she's usually on the phone with her dad when he gets/makes calls here. I told her that I would happy to send a note back to his nurse, but that's all that I could do.

## 2014-01-20 NOTE — Telephone Encounter (Signed)
Patient should be taking 3 medications - generic avapro, metoprolol and amlodipine.  Please confirm pt is taking all three medications.

## 2014-01-23 NOTE — Telephone Encounter (Signed)
Patient should have OV re:  Hypertension within next 2 weeks

## 2014-01-25 NOTE — Telephone Encounter (Signed)
pts daughter states BP is 152/82.  Scheduled appt for next Wednesday

## 2014-01-25 NOTE — Telephone Encounter (Signed)
Left message for pt to call back  °

## 2014-02-01 ENCOUNTER — Ambulatory Visit (INDEPENDENT_AMBULATORY_CARE_PROVIDER_SITE_OTHER): Payer: BC Managed Care – PPO | Admitting: Internal Medicine

## 2014-02-01 ENCOUNTER — Other Ambulatory Visit: Payer: Self-pay | Admitting: Internal Medicine

## 2014-02-01 ENCOUNTER — Encounter: Payer: Self-pay | Admitting: Internal Medicine

## 2014-02-01 VITALS — BP 158/82 | HR 92 | Temp 98.0°F | Ht 69.75 in | Wt 192.0 lb

## 2014-02-01 DIAGNOSIS — K219 Gastro-esophageal reflux disease without esophagitis: Secondary | ICD-10-CM

## 2014-02-01 DIAGNOSIS — I10 Essential (primary) hypertension: Secondary | ICD-10-CM

## 2014-02-01 DIAGNOSIS — E1149 Type 2 diabetes mellitus with other diabetic neurological complication: Secondary | ICD-10-CM

## 2014-02-01 DIAGNOSIS — N400 Enlarged prostate without lower urinary tract symptoms: Secondary | ICD-10-CM

## 2014-02-01 MED ORDER — DEXAMETHASONE 1 MG PO TABS: ORAL_TABLET | ORAL | Status: DC

## 2014-02-01 MED ORDER — OMEPRAZOLE-SODIUM BICARBONATE 40-1100 MG PO CAPS: 1.0000 | ORAL_CAPSULE | Freq: Every day | ORAL | Status: DC

## 2014-02-01 MED ORDER — METOPROLOL SUCCINATE ER 100 MG PO TB24: 100.0000 mg | ORAL_TABLET | Freq: Every day | ORAL | Status: DC

## 2014-02-01 MED ORDER — AMLODIPINE BESYLATE 10 MG PO TABS: 10.0000 mg | ORAL_TABLET | Freq: Every day | ORAL | Status: DC

## 2014-02-01 NOTE — Assessment & Plan Note (Signed)
Patient with intermittent GERD symptoms despite taking omeprazole 40 mg.  Switch to Zegerid.

## 2014-02-01 NOTE — Assessment & Plan Note (Signed)
Patient still experiencing BPH symptoms despite taking Flomax.  Consider add finasteride.

## 2014-02-01 NOTE — Assessment & Plan Note (Addendum)
Overall, blood sugar control improving.  Continue same dose of insulin.  Monitor A1c.  Screening iron studies not suggestive of iron overload.

## 2014-02-01 NOTE — Progress Notes (Signed)
Subjective:    Patient ID: Patrick Hernandez, male    DOB: 12-10-1955, 58 y.o.   MRN: [id number]  HPI  58 year old Asian male with history of uncontrolled type 2 diabetes, hypertension and hyperlipidemia for routine followup. Patient accompanied by a Nurse, learning disabilitytranslator.  His blood pressure remains poorly controlled despite taking Avapro 150 mg, amlodipine 5 mg and metoprolol XL 50 mg once daily. His home systolic blood pressure readings range from 140s to 160. He denies any associated headache or chest pain.  His blood sugars improving but still somewhat labile.  He denies significant hypoglycemia.  He has refractory reflux symptoms despite taking omeprazole.  Review of Systems Negative for chest pain.  No headache.  He has mild lower extremity edema    Past Medical History  Diagnosis Date  . Hypertension   . Palpitations   . Type II or unspecified type diabetes mellitus with neurological manifestations, not stated as uncontrolled   . Polyneuropathy in diabetes(357.2)     History   Social History  . Marital Status: Married    Spouse Name: N/A    Number of Children: N/A  . Years of Education: N/A   Occupational History  . Self employed    Social History Main Topics  . Smoking status: Never Smoker   . Smokeless tobacco: Not on file  . Alcohol Use: No  . Drug Use: No  . Sexual Activity: Not on file   Other Topics Concern  . 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 None      Family History  Problem Relation Age of Onset  . Diabetes Mother   . Diabetes Father   . Heart disease Father     Pacemaker    No Known Allergies  Current Outpatient Prescriptions on File Prior to Visit  Medication Sig Dispense Refill  . amLODipine (NORVASC) 5 MG tablet TAKE 1 TABLET (5 MG TOTAL) BY MOUTH DAILY.  30 tablet  0  . aspirin 81 MG tablet Take 81 mg by mouth daily.      Marland Kitchen.  atorvastatin (LIPITOR) 40 MG tablet TAKE 1 TABLET (40 MG TOTAL) BY MOUTH DAILY.  90 tablet  0  . BAYER CONTOUR NEXT TEST test strip 1 EACH BY OTHER ROUTE 3 (THREE) TIMES DAILY. USE AS INSTRUCTED  100 each  2  . insulin aspart (NOVOLOG) cartridge Inject 15 Units into the skin 2 (two) times daily before a meal.  15 mL  3  . Insulin Pen Needle (BD PEN NEEDLE NANO U/F) 32G X 4 MM MISC 1 each by Does not apply route 4 (four) times daily.  150 each  11  . irbesartan (AVAPRO) 150 MG tablet Take 1 tablet (150 mg total) by mouth daily.  90 tablet  0  . LANTUS SOLOSTAR 100 UNIT/ML Solostar Pen INJECT 0.3 MLS (30 UNITS TOTAL) INTO THE SKIN AT BEDTIME.  15 mL  4  . metoprolol succinate (TOPROL-XL) 50 MG 24 hr tablet Take 1 tablet (50 mg total) by mouth daily. Take with or immediately following a meal.  90 tablet  0  . omeprazole (PRILOSEC) 40 MG capsule Take 15 minutes before AM meal  90 capsule  0  . tamsulosin (FLOMAX) 0.4 MG CAPS capsule TAKE 1 CAPSULE (0.4 MG TOTAL) BY MOUTH DAILY AFTER SUPPER.  30 capsule  0   No current facility-administered  medications on file prior to visit.    BP 158/82  Pulse 92  Temp(Src) 98 F (36.7 C) (Oral)  Ht 5' 9.75" (1.772 m)  Wt 192 lb (87.091 kg)  BMI 27.74 kg/m2    Objective:   Physical Exam  Constitutional: He is oriented to person, place, and time. He appears well-developed and well-nourished. No distress.  HENT:  Head: Normocephalic and atraumatic.  Neck: Neck supple.  No carotid bruit  Cardiovascular: Normal rate, regular rhythm and normal heart sounds.   Pulmonary/Chest: Effort normal and breath sounds normal. He has no wheezes.  Abdominal: Soft. Bowel sounds are normal. There is no tenderness.  Musculoskeletal:  Trace bilateral lower extremity edema  Lymphadenopathy:    He has no cervical adenopathy.  Neurological: He is alert and oriented to person, place, and time. No cranial nerve deficit.  Skin: Skin is warm and dry.  Psychiatric: He has a  normal mood and affect. His behavior is normal.          Assessment & Plan:

## 2014-02-01 NOTE — Progress Notes (Signed)
Pre visit review using our clinic review tool, if applicable. No additional management support is needed unless otherwise documented below in the visit note. 

## 2014-02-01 NOTE — Assessment & Plan Note (Signed)
Patient has difficult to control hypertension.  Increase amlodipine to 10 mg and metoprolol XL to 100 mg.  Continue same dose of irbesartan.  He has mild renal insufficiency.  Referred to endocrinologist for further evaluation. Patient has difficult to control hypertension and moderate to severe type 2 diabetes. Rule out Cushing's. Obtain dexamethasone suppression test. Also obtain serum metanephrines.

## 2014-02-03 ENCOUNTER — Other Ambulatory Visit: Payer: Self-pay | Admitting: Internal Medicine

## 2014-02-07 ENCOUNTER — Other Ambulatory Visit: Payer: Self-pay | Admitting: *Deleted

## 2014-02-07 MED ORDER — INSULIN ASPART 100 UNIT/ML FLEXPEN: PEN_INJECTOR | SUBCUTANEOUS | Status: DC

## 2014-02-23 ENCOUNTER — Encounter: Payer: Self-pay | Admitting: Internal Medicine

## 2014-02-23 ENCOUNTER — Ambulatory Visit (INDEPENDENT_AMBULATORY_CARE_PROVIDER_SITE_OTHER): Payer: BC Managed Care – PPO | Admitting: Internal Medicine

## 2014-02-23 VITALS — BP 142/74 | HR 99 | Temp 98.5°F | Resp 12 | Ht 69.5 in | Wt 195.0 lb

## 2014-02-23 DIAGNOSIS — E1149 Type 2 diabetes mellitus with other diabetic neurological complication: Secondary | ICD-10-CM

## 2014-02-23 NOTE — Progress Notes (Signed)
Patient ID: Patrick Hernandez, male   DOB: 04-Oct-1956, 58 y.o.   MRN: [id number]  HPI: Patrick Hernandez is a 58 y.o.-year-old male, referred by his PCP, Dr.Yoo, for management of DM2, non-insulin-dependent, uncontrolled, without complications and resistant HTN (this will be addressed at next visit). He is here with the translator. He understands some [place].  DM2: Patient has been diagnosed with diabetes in 2000; he started insulin in 2013. Last hemoglobin A1c was: Lab Results  Component Value Date   HGBA1C 7.9* 10/12/2013   HGBA1C 12.0* 02/02/2013  Improvement in diet and starting to take the insulin consistently >> improved HbA1c.  Pt is on a regimen of: - Lantus 30 units qhs - Novolog 15 units bid ac (not with lunch) He has been on Metformin. I believe this might have been stopped 2/2 CKD.  Pt checks his sugars 1x a day and they are: - am: n/c - 2-3h after b'fast: 80-180  - before lunch: n/c - 2h after lunch: n/c - before dinner: n/c - 2h after dinner: n/c - bedtime: n/c - nighttime: n/c No lows. Lowest sugar was 80; he has hypoglycemia awareness at 90.  Highest sugar was 196  Pt's meals are: - Breakfast: brown rice, beans, soup (tofu, vegetables) - Lunch: same - Dinner: same, salads - Snacks: none  - Has CKD, last BUN/creatinine:  Lab Results  Component Value Date   BUN 25* 10/25/2013   CREATININE 1.6* 10/25/2013  On Avapro. - last set of lipids: Lab Results  Component Value Date   CHOL 199 02/02/2013   HDL 35.00* 02/02/2013   LDLDIRECT 74.4 02/02/2013   TRIG 556.0 Triglyceride is over 400; calculations on Lipids are invalid.* 02/02/2013   CHOLHDL 6 02/02/2013  On Lipitor. - last eye exam was in 2014 (1 year ago). ? DR. He had Laser Sx. - + numbness and tingling in his feet - 1rst-3rd toes bilaterally. He is on ASA 81.  I reviewed his chart and he also has a history of BPH, GERD, HTN.  Pt has FH of DM in father.   HTN: He is on: - Amlodipine 10 mg daily - Toprol XL  100 mg daily - Avapro 150 mg daily  He is checking BP at home: 120's-140s/76-80s. Today 142/74.  Per review of Epic chart: Vitals - 1 value per visit 02/23/2014 02/01/2014 12/16/2013 10/12/2013 02/18/2013  SYSTOLIC 142 158 409146 170 142  DIASTOLIC 74 82 82 92 82   Vitals - 1 value per visit 02/02/2013  SYSTOLIC 130  DIASTOLIC 68   No smoking, coffee, alcohol, + some excess salt (adds salt to his meals), no licorice use, no NSAIDs.   PCP ordered: - Dexamethasone suppression test - plasma metanephrines - renal arteries U/S >> he has not come to have the above tests done yet.  ROS: Constitutional: no weight gain/loss, no fatigue, no subjective hyperthermia/hypothermia Eyes: no blurry vision, no xerophthalmia ENT: no sore throat, no nodules palpated in throat, no dysphagia/odynophagia, no hoarseness Cardiovascular: no CP/SOB/palpitations/leg swelling Respiratory: no cough/SOB Gastrointestinal: no N/V/D/C, + heartburn Musculoskeletal: + both: muscle/joint aches Skin: + rash Neurological: no tremors/numbness/tingling/dizziness Psychiatric: no depression/anxiety  Past Medical History  Diagnosis Date  . Hypertension   . Palpitations   . Type II or unspecified type diabetes mellitus with neurological manifestations, not stated as uncontrolled   . Polyneuropathy in diabetes(357.2)    Past Surgical History  Procedure Laterality Date  . None     History   Social History  . Marital  Status: Married    Spouse Name: N/A    Number of Children: 2   Occupational History  . Self employed    Social History Main Topics  . Smoking status: Never Smoker   . Smokeless tobacco: Not on file  . Alcohol Use: No  . Drug Use: No   Social History Narrative   Married   Self employed - dry cleaning business   Current Outpatient Prescriptions on File Prior to Visit  Medication Sig Dispense Refill  . amLODipine (NORVASC) 10 MG tablet Take 1 tablet (10 mg total) by mouth daily.  90 tablet  1  .  amLODipine (NORVASC) 5 MG tablet TAKE 1 TABLET (5 MG TOTAL) BY MOUTH DAILY.  30 tablet  0  . aspirin 81 MG tablet Take 81 mg by mouth daily.      Marland Kitchen. atorvastatin (LIPITOR) 40 MG tablet Take 0.5 tablets (20 mg total) by mouth daily at 6 PM.  90 tablet  0  . BAYER CONTOUR NEXT TEST test strip 1 EACH BY OTHER ROUTE 3 (THREE) TIMES DAILY. USE AS INSTRUCTED  100 each  2  . dexamethasone (DECADRON) 1 MG tablet Take 11 PM (night before AM blood test)  1 tablet  0  . insulin aspart (NOVOLOG FLEXPEN) 100 UNIT/ML FlexPen INJECT 15 UNITS INTO THE SKIN 2 (TWO) TIMES DAILY BEFORE A MEAL.  15 mL  4  . Insulin Pen Needle (BD PEN NEEDLE NANO U/F) 32G X 4 MM MISC 1 each by Does not apply route 4 (four) times daily.  150 each  11  . irbesartan (AVAPRO) 150 MG tablet Take 1 tablet (150 mg total) by mouth daily.  90 tablet  0  . LANTUS SOLOSTAR 100 UNIT/ML Solostar Pen INJECT 0.3 MLS (30 UNITS TOTAL) INTO THE SKIN AT BEDTIME.  15 mL  4  . metoprolol succinate (TOPROL-XL) 100 MG 24 hr tablet Take 1 tablet (100 mg total) by mouth daily. Take with or immediately following a meal.  90 tablet  1  . omeprazole-sodium bicarbonate (ZEGERID) 40-1100 MG per capsule Take 1 capsule by mouth daily before breakfast.  90 capsule  1  . tamsulosin (FLOMAX) 0.4 MG CAPS capsule TAKE 1 CAPSULE (0.4 MG TOTAL) BY MOUTH DAILY AFTER SUPPER.  30 capsule  0   No current facility-administered medications on file prior to visit.   No Known Allergies Family History  Problem Relation Age of Onset  . Diabetes Mother   . Diabetes Father   . Heart disease Father     Pacemaker   PE: BP 142/74  Pulse 99  Temp(Src) 98.5 F (36.9 C) (Oral)  Resp 12  Ht 5' 9.5" (1.765 m)  Wt 195 lb (88.451 kg)  BMI 28.39 kg/m2  SpO2 97% Wt Readings from Last 3 Encounters:  02/23/14 195 lb (88.451 kg)  02/01/14 192 lb (87.091 kg)  12/16/13 195 lb (88.451 kg)   Constitutional: overweight, in NAD Eyes: PERRLA, EOMI, no exophthalmos ENT: moist mucous  membranes, no thyromegaly, no cervical lymphadenopathy Cardiovascular: RRR, No MRG Respiratory: CTA B Gastrointestinal: abdomen soft, NT, ND, BS+ Musculoskeletal: no deformities, strength intact in all 4 Skin: moist, warm, no rashes Neurological: no tremor with outstretched hands, DTR normal in all 4  ASSESSMENT: 1. DM2, insulin-dependent, uncontrolled, without complications - ? PAD (decreased pulses in LE)  2. HTN  PLAN:  1. Patient with long-standing, recently more controlled diabetes, on basal-bolus insulin regimen, with ? Ctrl since no recent HbA1c available. He checks sugars  once a day at home, and the readings are variable. - We discussed about options for treatment, and I suggested to:  Patient Instructions  Continue: - Lantus 30 units at bedtime - Continue NovoLog 15 units 2x a day, 15 min before meals Start writing sugars down in the log, check 2x a day, rotating check times.  Please return in 1 month with your sugar log.   - Strongly advised him to start checking sugars at different times of the day - check 2 times a day, rotating checks - given sugar log and advised how to fill it and to bring it at next appt  - given foot care handout and explained the principles  - given instructions for hypoglycemia management "15-15 rule"  - advised for yearly eye exams - Return to clinic in 1 mo with sugar log   2. HTN - referred from PCP to look for secondary causes of HTN - He is on a good regimen with a beta blocker, ARB and CCB. BPs at home and here are at goal for a diabetic per [facility]. - advised him to try to reduce salt - will move Toprol XT at bedtime since he gets tired if taking it in am - I added an aldosterone and a renin activity to the labs already ordered by PCP (see HPI) and advised him to:  Patient Instructions  - Please take Toprol XL at night. - Continue Amlodipine 10 mg in am. - Continue Avapro 150 mg in am. Continue to check your blood pressure at  home. Bring your BP machine next time to validate it with our machine. Limit salt in your diet.  Please return for labs fasting, in am. Take the Dexamethasone tablet at 11 pm the night before coming for a cortisol level.  Contact:  Lafonda Mosses (daughter) - in [facility]: [phone]  03/15/2014 Still awaiting labs.

## 2014-02-23 NOTE — Patient Instructions (Signed)
Continue: - Lantus 30 units at bedtime - Continue NovoLog 15 units 2x a day, 15 min before meals Start writing sugars down in the log, check 2x a day, rotating check times.  Please return in 1 month with your sugar log.   - Please take Toprol XL at night. - Continue Amlodipine 10 mg in am. - Continue Avapro 150 mg in am. Continue to check your blood pressure at home. Bring your BP machine next time to validate it with our machine. Limit salt in your diet.  Please return for labs fasting, in am. Take the Dexamethasone tablet at 11 pm the night before coming for a cortisol level.   PATIENT INSTRUCTIONS FOR TYPE 2 DIABETES:  **Please join MyChart!** - see attached instructions about how to join   DIET AND EXERCISE Diet and exercise is an important part of diabetic treatment.  We recommended aerobic exercise in the form of brisk walking (working between 40-60% of maximal aerobic capacity, similar to brisk walking) for 150 minutes per week (such as 30 minutes five days per week) along with 3 times per week performing 'resistance' training (using various gauge rubber tubes with handles) 5-10 exercises involving the major muscle groups (upper body, lower body and core) performing 10-15 repetitions (or near fatigue) each exercise. Start at half the above goal but build slowly to reach the above goals. If limited by weight, joint pain, or disability, we recommend daily walking in a swimming pool with water up to waist to reduce pressure from joints while allow for adequate exercise.    BLOOD GLUCOSES Monitoring your blood glucoses is important for continued management of your diabetes. Please check your blood glucoses 2-4 times a day: fasting, before meals and at bedtime (you can rotate these measurements - e.g. one day check before the 3 meals, the next day check before 2 of the meals and before bedtime, etc.   HYPOGLYCEMIA (low blood sugar) Hypoglycemia is usually a reaction to not eating,  exercising, or taking too much insulin/ other diabetes drugs.  Symptoms include tremors, sweating, hunger, confusion, headache, etc. Treat IMMEDIATELY with 15 grams of Carbs:   4 glucose tablets    cup regular juice/soda   2 tablespoons raisins   4 teaspoons sugar   1 tablespoon honey Recheck blood glucose in 15 mins and repeat above if still symptomatic/blood glucose <100. Please contact our office at [phone] if you have questions about how to next handle your insulin.  RECOMMENDATIONS TO REDUCE YOUR RISK OF DIABETIC COMPLICATIONS: * Take your prescribed MEDICATION(S). * Follow a DIABETIC diet: Complex carbs, fiber rich foods, heart healthy fish twice weekly, (monounsaturated and polyunsaturated) fats * AVOID saturated/trans fats, high fat foods, >2,300 mg salt per day. * EXERCISE at least 5 times a week for 30 minutes or preferably daily.  * DO NOT SMOKE OR DRINK more than 1 drink a day. * Check your FEET every day. Do not wear tightfitting shoes. Contact us if you develop an ulcer * See your EYE doctor once a year or more if needed * Get a FLU shot once a year * Get a PNEUMONIA vaccine once before and once after age 57 years  GOALS:  * Your Hemoglobin A1c of <7%  * fasting sugars need to be <130 * after meals sugars need to be <180 (2h after you start eating) * Your Systolic BP should be 140 or lower  * Your Diastolic BP should be 80 or lower  * Your HDL (Good Cholesterol)  should be 40 or higher  * Your LDL (Bad Cholesterol) should be 100 or lower  * Your Triglycerides should be 150 or lower  * Your Urine microalbumin (kidney function) should be <30 * Your Body Mass Index should be 25 or lower   We will be glad to help you achieve these goals. Our telephone number is: [phone].

## 2014-02-27 ENCOUNTER — Other Ambulatory Visit (INDEPENDENT_AMBULATORY_CARE_PROVIDER_SITE_OTHER): Payer: BC Managed Care – PPO

## 2014-02-27 DIAGNOSIS — I10 Essential (primary) hypertension: Secondary | ICD-10-CM

## 2014-02-27 DIAGNOSIS — E1149 Type 2 diabetes mellitus with other diabetic neurological complication: Secondary | ICD-10-CM

## 2014-02-27 LAB — BASIC METABOLIC PANEL
BUN: 28 mg/dL — AB (ref 6–23)
CO2: 29 meq/L (ref 19–32)
CREATININE: 1.3 mg/dL (ref 0.4–1.5)
Calcium: 8.9 mg/dL (ref 8.4–10.5)
Chloride: 103 mEq/L (ref 96–112)
GFR: 58.35 mL/min — ABNORMAL LOW (ref >60.00)
Glucose, Bld: 200 mg/dL — ABNORMAL HIGH (ref 70–99)
Potassium: 4 mEq/L (ref 3.5–5.1)
Sodium: 139 mEq/L (ref 135–145)

## 2014-02-27 LAB — HEMOGLOBIN A1C: Hgb A1c MFr Bld: 8.3 % — ABNORMAL HIGH (ref 4.6–6.5)

## 2014-02-28 LAB — CORTISOL-AM, BLOOD: CORTISOL - AM: 21.8 ug/dL (ref 4.3–22.4)

## 2014-03-02 ENCOUNTER — Telehealth: Payer: Self-pay

## 2014-03-02 NOTE — Telephone Encounter (Signed)
Relevant patient education mailed to patient.  

## 2014-03-03 LAB — METANEPHRINES, PLASMA
Metanephrine, Free: 36 pg/mL (ref <=57)
Normetanephrine, Free: 67 pg/mL (ref <=148)
Total Metanephrines-Plasma: 103 pg/mL (ref <=205)

## 2014-03-13 ENCOUNTER — Telehealth: Payer: Self-pay | Admitting: Internal Medicine

## 2014-03-13 NOTE — Telephone Encounter (Signed)
Pt states he was to start metFORMIN (GLUCOPHAGE) 500 MG tablet  Needs today.  pls call to confirm. Again per dr Artist Paisyoo. [facility] Pt 's daughter is also waiting to hear if pt needs to redo labs. pls advise.

## 2014-03-13 NOTE — Telephone Encounter (Signed)
Lab appt was scheduled however, Dr. Elvera LennoxGherghe handles diabetes.  Called pts daughter to let her know I was forwarding message to Dr Elvera LennoxGherghe and to contact endocrinology in the future for anything related to diabetes.  Pt does not speak english that well so please contact daughter

## 2014-03-14 ENCOUNTER — Other Ambulatory Visit: Payer: Self-pay | Admitting: *Deleted

## 2014-03-14 MED ORDER — METFORMIN HCL 500 MG PO TABS: 500.0000 mg | ORAL_TABLET | Freq: Two times a day (BID) | ORAL | Status: DC

## 2014-03-14 NOTE — Telephone Encounter (Signed)
Rx for metformin sent to pt's pharmacy.

## 2014-03-14 NOTE — Telephone Encounter (Signed)
OK to restart Metformin at 500 mg bid with meals (#60, refills 2) as advised per PCP. Carollee HerterShannon, can you please call this in? I will d/w at next visit (in few days) about the remaining tests and how he should prepare for them.

## 2014-03-16 ENCOUNTER — Other Ambulatory Visit: Payer: Self-pay | Admitting: *Deleted

## 2014-03-16 DIAGNOSIS — I10 Essential (primary) hypertension: Secondary | ICD-10-CM

## 2014-03-16 DIAGNOSIS — E1149 Type 2 diabetes mellitus with other diabetic neurological complication: Secondary | ICD-10-CM

## 2014-03-17 ENCOUNTER — Other Ambulatory Visit: Payer: BC Managed Care – PPO

## 2014-03-17 DIAGNOSIS — E1149 Type 2 diabetes mellitus with other diabetic neurological complication: Secondary | ICD-10-CM

## 2014-03-17 DIAGNOSIS — I10 Essential (primary) hypertension: Secondary | ICD-10-CM

## 2014-03-21 LAB — METANEPHRINES, PLASMA
Metanephrine, Free: 42 pg/mL (ref <=57)
Normetanephrine, Free: 51 pg/mL (ref <=148)
Total Metanephrines-Plasma: 93 pg/mL (ref <=205)

## 2014-03-30 ENCOUNTER — Encounter: Payer: Self-pay | Admitting: Internal Medicine

## 2014-03-30 ENCOUNTER — Ambulatory Visit (INDEPENDENT_AMBULATORY_CARE_PROVIDER_SITE_OTHER): Payer: BC Managed Care – PPO | Admitting: Internal Medicine

## 2014-03-30 VITALS — BP 148/72 | HR 99 | Temp 98.1°F | Resp 12 | Wt 193.0 lb

## 2014-03-30 DIAGNOSIS — E1149 Type 2 diabetes mellitus with other diabetic neurological complication: Secondary | ICD-10-CM

## 2014-03-30 DIAGNOSIS — I10 Essential (primary) hypertension: Secondary | ICD-10-CM

## 2014-03-30 LAB — POTASSIUM: Potassium: 4.4 mEq/L (ref 3.5–5.1)

## 2014-03-30 MED ORDER — DEXAMETHASONE 1 MG PO TABS: ORAL_TABLET | ORAL | Status: DC

## 2014-03-30 NOTE — Progress Notes (Signed)
Patient ID: Patrick Hernandez, male   DOB: 05-13-1956, 58 y.o.   MRN: [id number]  HPI: Patrick CromeHyun Kyu Jehle is a 58 y.o.-year-old male, returning for f/u for DM2, dx 2000, insulin-dependent since 2013, uncontrolled, without complications and resistant HTN. He is here with the translator. He understands some [place]. Last visit 5 weeks ago.  DM2: Last hemoglobin A1c was: Lab Results  Component Value Date   HGBA1C 8.3* 02/27/2014   HGBA1C 7.9* 10/12/2013   HGBA1C 12.0* 02/02/2013  Improvement in diet and starting to take the insulin consistently >> improved HbA1c.  Pt is on a regimen of: - Lantus 30 units qhs - Novolog 8-12 units bid ac (not with lunch) He has been on Metformin. I believe this might have been stopped 2/2 CKD.  Pt checks his sugars 1x a day and they are: - am: n/c >> 107-205 - 2-3h after b'fast: 80-180 >> 73-140 - before lunch: n/c - 2h after lunch: n/c >> 135-165 - before dinner: n/c - 2h after dinner: n/c >> 54 (if takes 12 units NovoLog) - 170 - bedtime: n/c - nighttime: n/c No lows. Lowest sugar was 54; he has hypoglycemia awareness at 90.  Highest sugar was 205.  Pt's meals are: - Breakfast: brown rice, beans, soup (tofu, vegetables) - Lunch: same - Dinner: same, salads - Snacks: none  - Has CKD, last BUN/creatinine:  Lab Results  Component Value Date   BUN 28* 02/27/2014   CREATININE 1.3 02/27/2014  On Avapro. - last set of lipids: Lab Results  Component Value Date   CHOL 199 02/02/2013   HDL 35.00* 02/02/2013   LDLDIRECT 74.4 02/02/2013   TRIG 556.0 Triglyceride is over 400; calculations on Lipids are invalid.* 02/02/2013   CHOLHDL 6 02/02/2013  On Lipitor. - last eye exam was in 2014 (1 year ago). ? DR. He had Laser Sx. - + numbness and tingling in his feet - 1rst-3rd toes bilaterally. He is on ASA 81.  I reviewed his chart and he also has a history of BPH, GERD, HTN.  HTN: He is on: - Amlodipine 10 mg daily - Toprol XL 100 mg daily - Avapro 150 mg  daily Per review of his home log: BP at home:115-122/70s. Here 148/72.  To investigate secondary causes for hypertension, the following tests were ordered: - Dexamethasone suppression test >> not performed yet (of note, he had a positive DST test but he did not take the dexamethasone the night before, so then he subsequently took the dexamethasone the night before coming to the, however the wrong lab was drawn the next morning - plasma metanephrines rather than cortisol). - plasma metanephrines >> normal  - renal arteries U/S >> not performed yet  ROS: Constitutional: no weight gain/loss, no fatigue, no subjective hyperthermia/hypothermia Eyes: + blurry vision, no xerophthalmia ENT: no sore throat, no nodules palpated in throat, no dysphagia/odynophagia, no hoarseness Cardiovascular: no CP/SOB/palpitations/leg swelling Respiratory: no cough/SOB Gastrointestinal: no N/V/D/+ C, no heartburn Musculoskeletal: + both: muscle/joint aches Skin: no rash, + itching (feet) Neurological: no tremors/numbness/tingling/dizziness  PE: BP 148/72  Pulse 99  Temp(Src) 98.1 F (36.7 C) (Oral)  Resp 12  Wt 193 lb (87.544 kg)  SpO2 96% Wt Readings from Last 3 Encounters:  03/30/14 193 lb (87.544 kg)  02/23/14 195 lb (88.451 kg)  02/01/14 192 lb (87.091 kg)   Constitutional: overweight, in NAD Eyes: PERRLA, EOMI, no exophthalmos ENT: moist mucous membranes, no thyromegaly, no cervical lymphadenopathy Cardiovascular: RRR, No MRG Respiratory: CTA B Gastrointestinal:  abdomen soft, NT, ND, BS+ Musculoskeletal: no deformities, strength intact in all 4 Skin: moist, warm, no rashes Neurological: no tremor with outstretched hands, DTR normal in all 4  ASSESSMENT: 1. DM2, insulin-dependent, uncontrolled, without complications - ? PAD (decreased pulses in LE)  2. HTN  PLAN:  1. Patient with long-standing, recently more controlled diabetes, on basal-bolus insulin regimen. He now checks sugars 3x a  day at home, mostly after meals, and the readings are variable. He has better sugars later in the day. He may have hypoglycemia if he injects 10-12 units NovoLog before a meal, depending on the size of the meal. - I suggested to:  Patient Instructions  Continue Metformin but increase to 500 mg 2x a day with meals. Continue Lantus 30 units at bedtime. Continue NovoLog but change the dose ad follows: - 6 units with a smaller meal - 8 units with a regular meal - 10 units with a larger meal Please stop at the lab. Please take the Dexamethasone tablet 1 mg at 11 pm and come for a cortisol check the following am at 8. Please return in 1.5 month with your sugar log.  Check sugars 3x a day, mainly before meals and also occasionally 2h after meals or bedtime. - continue checking sugars at different times of the day - check 3 times a day, but mostly before meals, rotating checks - given new sugar logs - advised for yearly eye exams - Return to clinic in 1.5 mo with sugar log   2. HTN - He is on a good regimen with a beta blocker, ARB and CCB. BPs at home all at goal, here a little higher. - I added an aldosterone and a renin activity and a potassium level and advised him how to do the DST correctly - sent new Dex tablet to his pharmacy  Contact:  Lafonda MossesDiana (daughter) [phone]  04/21/2014 Am cortisol following 11 pm Dexamethasone still pending.

## 2014-03-30 NOTE — Patient Instructions (Addendum)
Continue Metformin but increase to 500 mg 2x a day with meals. Continue Lantus 30 units at bedtime. Continue NovoLog but change the dose ad follows: - 6 units with a smaller meal - 8 units with a regular meal - 10 units with a larger meal  Please stop at the lab.  Please take the Dexamethasone tablet 1 mg at 11 pm and come for a cortisol check the following am at 8.  Please return in 1.5 month with your sugar log.  Check sugars 3x a day, mainly before meals and also occasionally 2h after meals or bedtime.

## 2014-04-03 ENCOUNTER — Other Ambulatory Visit: Payer: Self-pay | Admitting: Internal Medicine

## 2014-04-05 ENCOUNTER — Other Ambulatory Visit: Payer: Self-pay | Admitting: Internal Medicine

## 2014-04-11 LAB — ALDOSTERONE + RENIN ACTIVITY W/ RATIO
ALDO / PRA RATIO: 0.5 ratio — AB (ref 0.9–28.9)
ALDOSTERONE: 2 ng/dL
PRA LC/MS/MS: 3.66 ng/mL/h (ref 0.25–5.82)

## 2014-04-13 ENCOUNTER — Encounter: Payer: Self-pay | Admitting: *Deleted

## 2014-04-24 ENCOUNTER — Other Ambulatory Visit: Payer: Self-pay | Admitting: Internal Medicine

## 2014-05-04 ENCOUNTER — Other Ambulatory Visit: Payer: Self-pay | Admitting: Internal Medicine

## 2014-06-21 ENCOUNTER — Other Ambulatory Visit: Payer: Self-pay | Admitting: Internal Medicine

## 2014-07-26 ENCOUNTER — Other Ambulatory Visit (INDEPENDENT_AMBULATORY_CARE_PROVIDER_SITE_OTHER): Payer: BC Managed Care – PPO

## 2014-07-26 DIAGNOSIS — I10 Essential (primary) hypertension: Secondary | ICD-10-CM

## 2014-07-26 DIAGNOSIS — E1149 Type 2 diabetes mellitus with other diabetic neurological complication: Secondary | ICD-10-CM

## 2014-07-26 LAB — CORTISOL: Cortisol, Plasma: 1.2 ug/dL

## 2014-07-30 LAB — METANEPHRINES, PLASMA
METANEPHRINE FREE: 28 pg/mL (ref <=57)
Normetanephrine, Free: 53 pg/mL (ref <=148)
Total Metanephrines-Plasma: 81 pg/mL (ref <=205)

## 2014-08-01 ENCOUNTER — Encounter: Payer: Self-pay | Admitting: *Deleted

## 2014-08-07 ENCOUNTER — Other Ambulatory Visit: Payer: Self-pay | Admitting: Internal Medicine

## 2014-08-14 ENCOUNTER — Telehealth: Payer: Self-pay | Admitting: Internal Medicine

## 2014-08-14 NOTE — Telephone Encounter (Signed)
Daughter called said you can leave the results on her phone if you can not reach her..Marland Kitchen

## 2014-08-15 NOTE — Telephone Encounter (Signed)
Normal lab results left on daughter's voicemail

## 2014-09-04 ENCOUNTER — Other Ambulatory Visit: Payer: Self-pay | Admitting: Internal Medicine

## 2014-09-06 ENCOUNTER — Other Ambulatory Visit: Payer: Self-pay | Admitting: Internal Medicine

## 2014-09-07 ENCOUNTER — Telehealth: Payer: Self-pay | Admitting: Internal Medicine

## 2014-09-07 ENCOUNTER — Other Ambulatory Visit: Payer: Self-pay | Admitting: *Deleted

## 2014-09-07 MED ORDER — METFORMIN HCL 500 MG PO TABS: 500.0000 mg | ORAL_TABLET | Freq: Two times a day (BID) | ORAL | Status: DC

## 2014-09-07 NOTE — Telephone Encounter (Signed)
Done

## 2014-09-07 NOTE — Telephone Encounter (Signed)
Patients daughter called stating that Mr. Nedra HaiLee has been out of his metformin for 2 days  The patient has an appointment on Nov 6th   Please refill patient rx   Thank you

## 2014-09-11 ENCOUNTER — Other Ambulatory Visit: Payer: Self-pay | Admitting: Internal Medicine

## 2014-09-15 ENCOUNTER — Encounter: Payer: Self-pay | Admitting: Internal Medicine

## 2014-09-15 ENCOUNTER — Ambulatory Visit (INDEPENDENT_AMBULATORY_CARE_PROVIDER_SITE_OTHER): Payer: BC Managed Care – PPO | Admitting: Internal Medicine

## 2014-09-15 VITALS — BP 138/78 | HR 78 | Temp 98.1°F | Wt 193.0 lb

## 2014-09-15 DIAGNOSIS — E1140 Type 2 diabetes mellitus with diabetic neuropathy, unspecified: Secondary | ICD-10-CM

## 2014-09-15 DIAGNOSIS — I10 Essential (primary) hypertension: Secondary | ICD-10-CM

## 2014-09-15 DIAGNOSIS — E1149 Type 2 diabetes mellitus with other diabetic neurological complication: Secondary | ICD-10-CM

## 2014-09-15 LAB — LIPID PANEL
Cholesterol: 109 mg/dL (ref 0–200)
HDL: 29.1 mg/dL — ABNORMAL LOW (ref >39.00)
LDL CALC: 56 mg/dL (ref 0–99)
NonHDL: 79.9
Total CHOL/HDL Ratio: 4
Triglycerides: 122 mg/dL (ref 0.0–149.0)
VLDL: 24.4 mg/dL (ref 0.0–40.0)

## 2014-09-15 LAB — HEMOGLOBIN A1C: HEMOGLOBIN A1C: 7.5 % — AB (ref 4.6–6.5)

## 2014-09-15 MED ORDER — METFORMIN HCL 500 MG PO TABS: ORAL_TABLET | ORAL | Status: DC

## 2014-09-15 NOTE — Patient Instructions (Signed)
  Continue Metformin but increase to 500 mg in am and 1000 mg with dinner (2 tablets) Continue Lantus 30 units at bedtime. Continue NovoLog as follows: - 10 units with a smaller meal - 13 units with a regular meal - 15 units with a larger meal Please stop at the lab. Please return in 3 months with your sugar log.  Check sugars 2-3x a day

## 2014-09-15 NOTE — Progress Notes (Signed)
Patient ID: Patrick Hernandez, male   DOB: [birth year], 58 y.o.   MRN: [id number]  HPI: Patrick Hernandez is a 58 y.o.-year-old male, returning for f/u for DM2, dx 2000, insulin-dependent since 2013, uncontrolled, without complications and HTN. Last visit 6 mo ago.  DM2: Last hemoglobin A1c was: Lab Results  Component Value Date   HGBA1C 8.3* 02/27/2014   HGBA1C 7.9* 10/12/2013   HGBA1C 12.0* 02/02/2013  Improvement in diet and starting to take the insulin consistently >> improved HbA1c.  Pt is on a regimen of: - Lantus 30 units qhs - Novolog: - 10 units with a smaller meal - 13 units with a regular meal - 15 units with a larger meal He has been on Metformin. I believe this might have been stopped 2/2 CKD.  Pt checks his sugars 2-3x a day and they are better: - am: n/c >> 107-205 >> 107-189 (higher if eats a late dinner) - 2-3h after b'fast: 80-180 >> 73-140 >> 95-128 - before lunch: n/c - 2h after lunch: n/c >> 135-165 >> n/c - before dinner: n/c >> 110-147, 206 (rice) - 2h after dinner: n/c >> 54 (if takes 12 units NovoLog) - 170 >> 84-145 - bedtime: n/c - nighttime: n/c No lows. Lowest sugar was 54 >> 84 now; he has hypoglycemia awareness at 90.  Highest sugar was 206.  Pt's meals are: - Breakfast: brown rice, beans, soup (tofu, vegetables) - Lunch: same - Dinner: same, salads - Snacks: none  - Has CKD, last BUN/creatinine:  Lab Results  Component Value Date   BUN 28* 02/27/2014   CREATININE 1.3 02/27/2014  On Avapro. - last set of lipids: Lab Results  Component Value Date   CHOL 199 02/02/2013   HDL 35.00* 02/02/2013   LDLDIRECT 74.4 02/02/2013   TRIG * 02/02/2013    556.0 Triglyceride is over 400; calculations on Lipids are invalid.   CHOLHDL 6 02/02/2013  On Lipitor. - last eye exam was in 2014. ? DR. He had Laser Sx. - + numbness and tingling in his feet - 1rst-3rd toes bilaterally. He is on ASA 81.  I reviewed his chart and he also has a history of BPH, GERD,  HTN.  HTN: He is on: - Amlodipine 10 mg daily - Toprol XL 100 mg daily - Avapro 150 mg daily Per review of his home log: BP at home:120s-130s/60-80s. Rarely sBP in the 140s at home  No apparent secondary causes for hypertension. The following tests were reviewed with the pt: - Dexamethasone suppression test >> normal - Aldosterone/PRA >> normal - plasma metanephrines and catecholamines >> normal  - TFTs >> normal  Component     Latest Ref Rng 10/12/2013 02/27/2014 03/17/2014 03/30/2014 07/26/2014  Metanephrine, Free     <=57 pg/mL  36 42    Normetanephrine, Free     <=148 pg/mL  67 51    Total Metanephrines-Plasma     <=205 pg/mL  103 93    PRA LC/MS/MS     0.25 - 5.82 ng/mL/h    3.66   ALDO / PRA Ratio     0.9 - 28.9 Ratio    0.5 (L)   ALDOSTERONE         2   TSH     0.35 - 5.50 uIU/mL 1.31      Free T4     0.60 - 1.60 ng/dL 7.82      Potassium     3.5 - 5.1 mEq/L  4.4   Cortisol, Plasma          1.2   I reviewed pt's medications, allergies, PMH, social hx, family hx and no changes required, except as mentioned above.  ROS: Constitutional: no weight gain/loss, no fatigue, no subjective hyperthermia/hypothermia Eyes: no blurry vision, no xerophthalmia ENT: no sore throat, no nodules palpated in throat, no dysphagia/odynophagia, no hoarseness Cardiovascular: no CP/SOB/palpitations/leg swelling Respiratory: no cough/SOB Gastrointestinal: no N/V/D/C, no heartburn Musculoskeletal: no muscle/joint aches Skin: no rash Neurological: no tremors/numbness/tingling/dizziness  PE: BP 138/78 mmHg  Temp(Src) 98.1 F (36.7 C) (Oral)  Wt 193 lb (87.544 kg) Body mass index is 28.1 kg/(m^2).  Wt Readings from Last 3 Encounters:  09/15/14 193 lb (87.544 kg)  03/30/14 193 lb (87.544 kg)  02/23/14 195 lb (88.451 kg)   Constitutional: overweight, in NAD Eyes: PERRLA, EOMI, no exophthalmos ENT: moist mucous membranes, no thyromegaly, no cervical lymphadenopathy Cardiovascular:  RRR, No MRG, + pitting bilat LE swelling Respiratory: CTA B Gastrointestinal: abdomen soft, NT, ND, BS+ Musculoskeletal: no deformities, strength intact in all 4 Skin: moist, warm, no rashes Neurological: no tremor with outstretched hands, DTR normal in all 4  ASSESSMENT: 1. DM2, insulin-dependent, uncontrolled, without complications - ? PAD (decreased pulses in LE)  2. HTN  PLAN:  1. Patient with long-standing, recently more controlled diabetes, on basal-bolus insulin regimen + metformin. He now checks sugars 2-3x a day and the readings are improved. He has better sugars later in the day.  - I suggested to:  Patient Instructions  Continue Metformin but increase to 500 mg in am and 1000 mg with dinner (2 tablets) Continue Lantus 30 units at bedtime. Continue NovoLog as follows: - 10 units with a smaller meal - 13 units with a regular meal - 15 units with a larger meal Please stop at the lab. Please return in 3 months with your sugar log.  Check sugars 2-3x a day  - continue checking sugars at different times of the day - check 2-3 times a day, but mostly before meals, rotating checks - given new sugar logs - advised for yearly eye exams - had flu vaccine 1 mo ago - check A1c and lipids - refilled metformin - advised to schedule a new eye exam - Return to clinic in 1.5 mo with sugar log   2. HTN - He is on a good regimen with a beta blocker, ARB and CCB. BPs at home all at goal. - We r/o endocrine causes of HTN - reviewed all these with him: 1. Primary hyperaldosteronism (Aldo not increased, Aldo/PRA not increased) 2. [place] sd. (normal DST) 3. Pheochromocytoma (normal CA and metanephrines)   Contact:  Patrick Hernandez (daughter) [facility] [phone]  Office Visit on 09/15/2014  Component Date Value Ref Range Status  . Cholesterol 09/15/2014 109  0 - 200 mg/dL Final   ATP III Classification       Desirable:  < 200 mg/dL               Borderline High:  200 - 239 mg/dL           High:  > = 098240 mg/dL  . Triglycerides 09/15/2014 122.0  0.0 - 149.0 mg/dL Final   Normal:  <119<150 mg/dLBorderline High:  150 - 199 mg/dL  . HDL 09/15/2014 29.10* >39.00 mg/dL Final  . VLDL 14/78/295611/04/2014 24.4  0.0 - 40.0 mg/dL Final  . LDL Cholesterol 09/15/2014 56  0 - 99 mg/dL Final  . Total CHOL/HDL Ratio 09/15/2014  4   Final                  Men          Women1/2 Average Risk     3.4          3.3Average Risk          5.0          4.42X Average Risk          9.6          7.13X Average Risk          15.0          11.0                      . NonHDL 09/15/2014 79.90   Final   NOTE:  Non-HDL goal should be 30 mg/dL higher than patient's LDL goal (i.e. LDL goal of < 70 mg/dL, would have non-HDL goal of < 100 mg/dL)  . Hgb A1c MFr Bld 09/15/2014 7.5* 4.6 - 6.5 % Final   Glycemic Control Guidelines for People with Diabetes:Non Diabetic:  <6%Goal of Therapy: <7%Additional Action Suggested:  >8%    HbA1c improved! TG and LDL improved, but HDL low.

## 2014-09-19 ENCOUNTER — Encounter: Payer: Self-pay | Admitting: *Deleted

## 2014-10-04 ENCOUNTER — Other Ambulatory Visit: Payer: Self-pay | Admitting: Internal Medicine

## 2014-10-21 ENCOUNTER — Other Ambulatory Visit: Payer: Self-pay | Admitting: Internal Medicine

## 2014-11-01 ENCOUNTER — Telehealth: Payer: Self-pay | Admitting: Internal Medicine

## 2014-11-01 ENCOUNTER — Other Ambulatory Visit: Payer: Self-pay | Admitting: Internal Medicine

## 2014-11-01 MED ORDER — AMLODIPINE BESYLATE 10 MG PO TABS: ORAL_TABLET | ORAL | Status: DC

## 2014-11-01 MED ORDER — TAMSULOSIN HCL 0.4 MG PO CAPS: ORAL_CAPSULE | ORAL | Status: DC

## 2014-11-01 MED ORDER — IRBESARTAN 150 MG PO TABS: ORAL_TABLET | ORAL | Status: DC

## 2014-11-01 NOTE — Telephone Encounter (Signed)
rx sent in electronically 

## 2014-11-01 NOTE — Telephone Encounter (Signed)
Yes, as long as they understand I have limited hrs.

## 2014-11-01 NOTE — Telephone Encounter (Signed)
Error

## 2014-11-01 NOTE — Telephone Encounter (Signed)
Pt has appt 12/04/14 but is out of some meds. Wants to know if you can refill until appt? amLODipine (NORVASC) 10 MG tablet tamsulosin (FLOMAX) 0.4 MG CAPS capsule irbesartan (AVAPRO) 150 MG tablet  [facility]/ [place]

## 2014-11-01 NOTE — Telephone Encounter (Signed)
Pt would like to know if Dr Artist PaisYoo will accept his wife as a new pt ..Marland Kitchen

## 2014-11-06 ENCOUNTER — Other Ambulatory Visit: Payer: Self-pay | Admitting: Internal Medicine

## 2014-12-04 ENCOUNTER — Telehealth: Payer: Self-pay | Admitting: Internal Medicine

## 2014-12-04 ENCOUNTER — Other Ambulatory Visit: Payer: Self-pay | Admitting: Internal Medicine

## 2014-12-04 ENCOUNTER — Ambulatory Visit: Payer: BC Managed Care – PPO | Admitting: Internal Medicine

## 2014-12-04 NOTE — Telephone Encounter (Signed)
Error

## 2014-12-06 ENCOUNTER — Other Ambulatory Visit: Payer: Self-pay

## 2014-12-06 MED ORDER — OMEPRAZOLE-SODIUM BICARBONATE 40-1100 MG PO CAPS: ORAL_CAPSULE | ORAL | Status: DC

## 2014-12-06 NOTE — Telephone Encounter (Signed)
Rx request for Omperazole/sodium 40-1100 capsule- Take 1 capsule by mouth daily before breakfast #30  [facility]   Pls advise.

## 2014-12-08 ENCOUNTER — Other Ambulatory Visit: Payer: Self-pay | Admitting: *Deleted

## 2014-12-11 ENCOUNTER — Ambulatory Visit: Payer: Self-pay | Admitting: Internal Medicine

## 2014-12-13 IMAGING — US US RENAL
1 series · 14 of 25 positions shown · non-contrast
Comparison: None.

CLINICAL DATA: Renal insufficiency. On medication for hypertension.

EXAM:
RENAL/URINARY TRACT ULTRASOUND COMPLETE

[Series 1: us renal · 0.26mm/px · 14 of 27 slices shown]
[im 1/27]
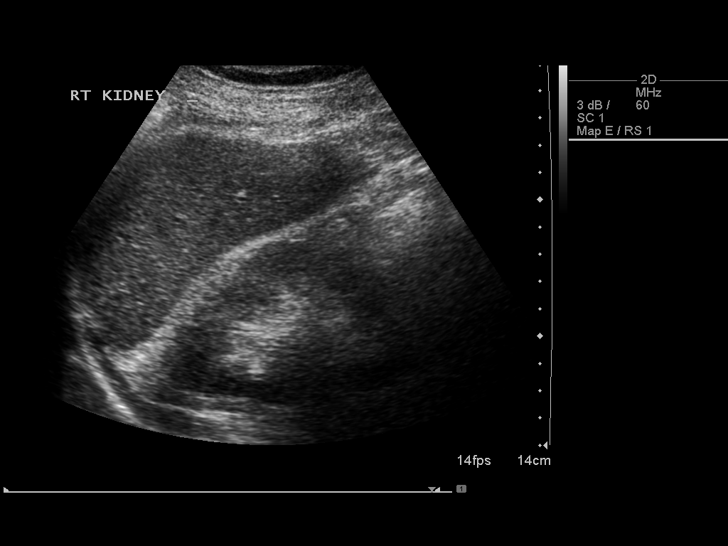
[im 3/27]
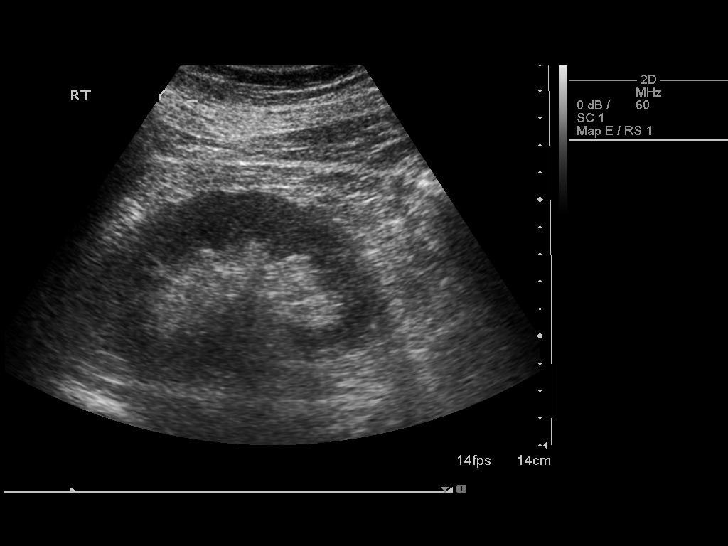
[im 5/27]
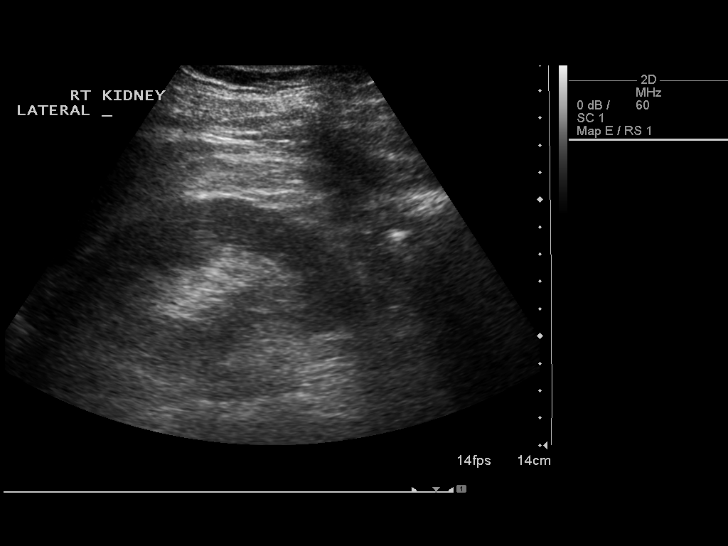
[im 7/27]
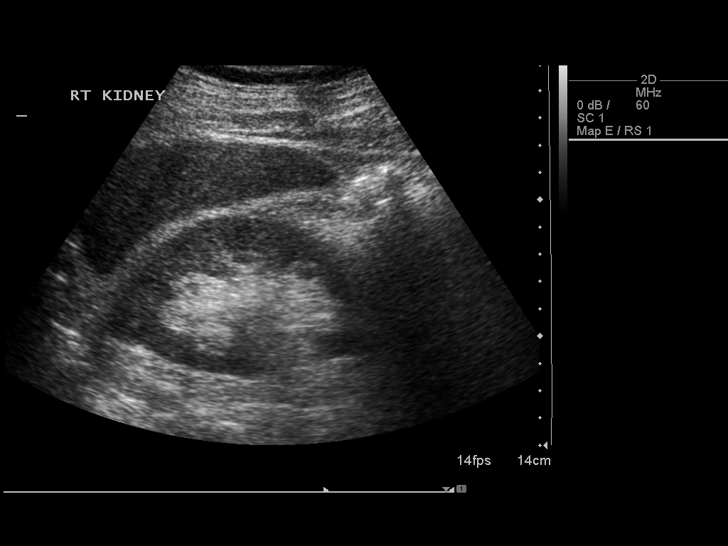
[im 9/27]
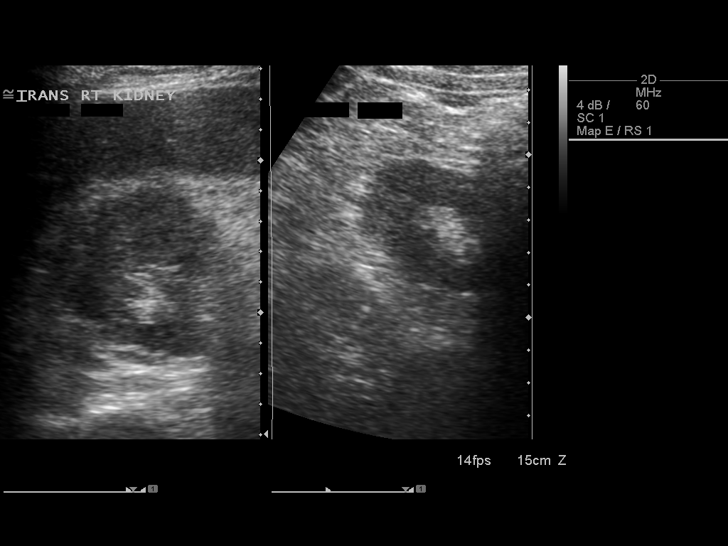
[im 10/27]
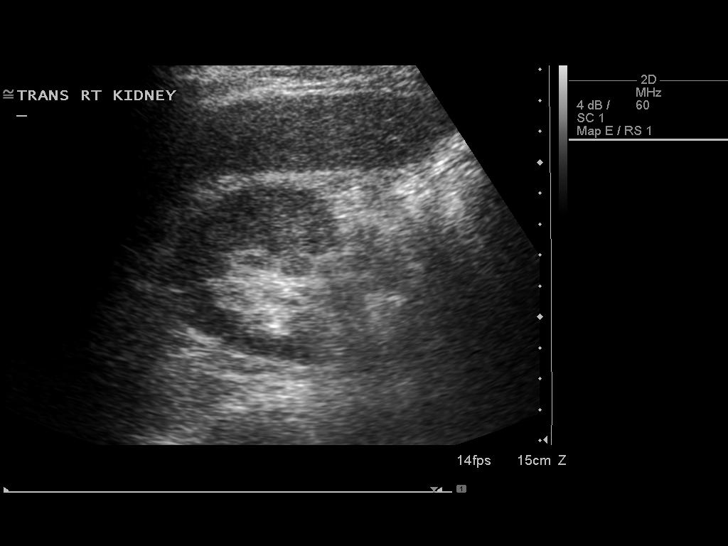
[im 12/27]
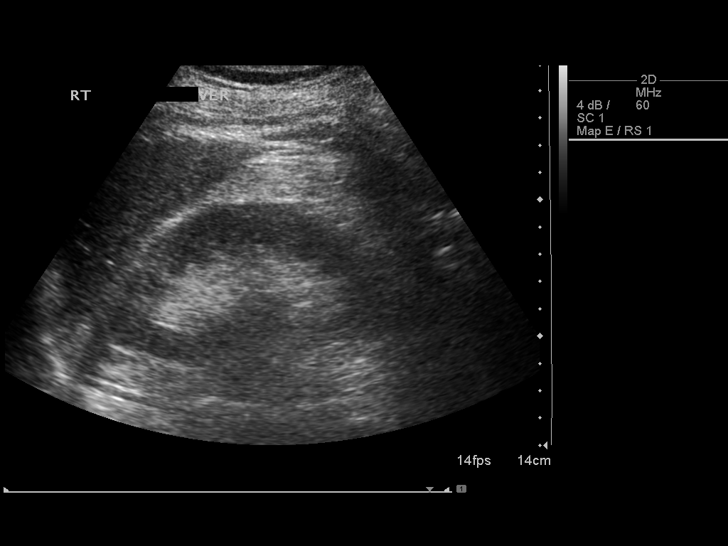
[im 15/27]
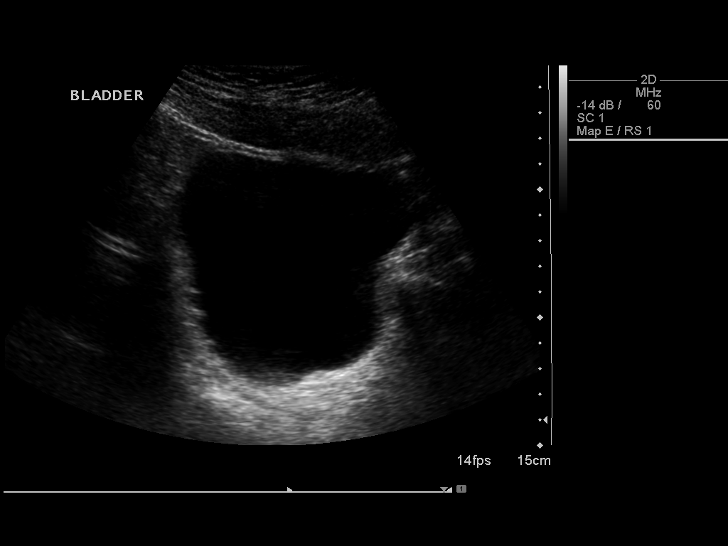
[im 17/27]
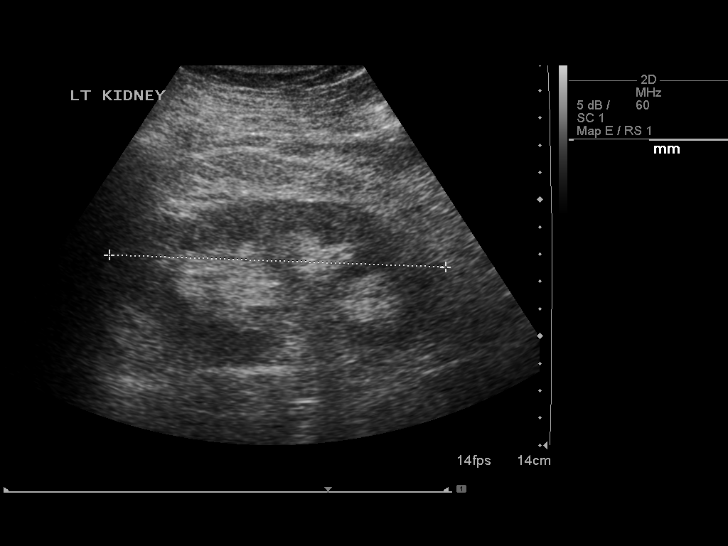
[im 18/27]
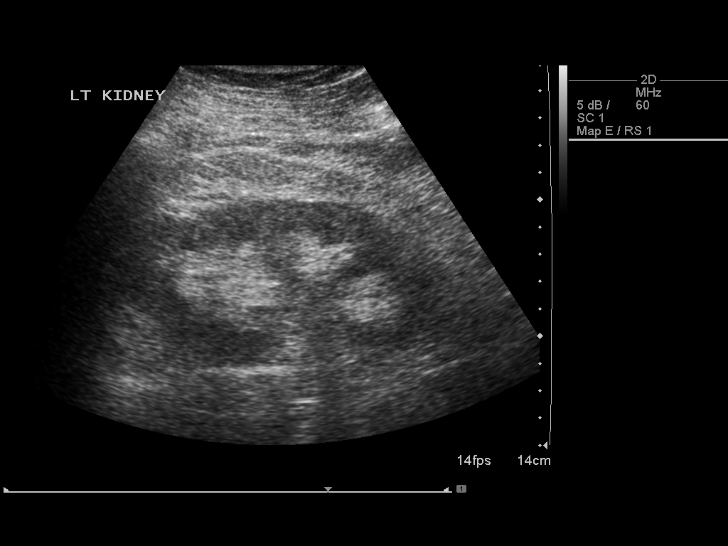
[im 20/27]
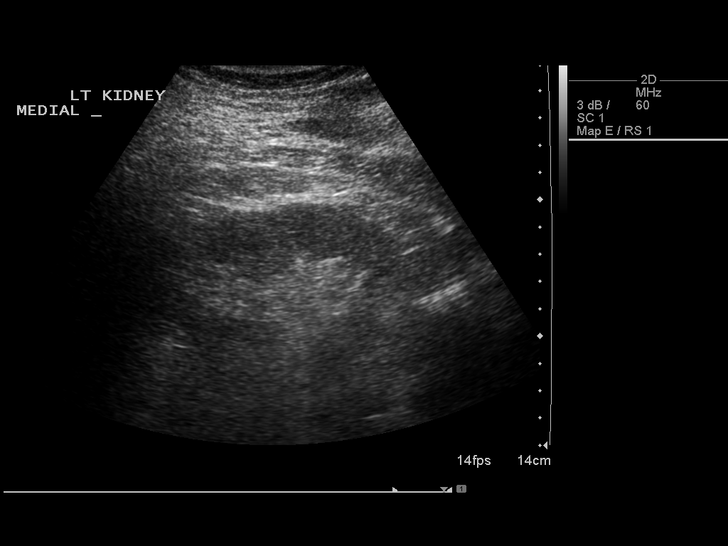
[im 22/27]
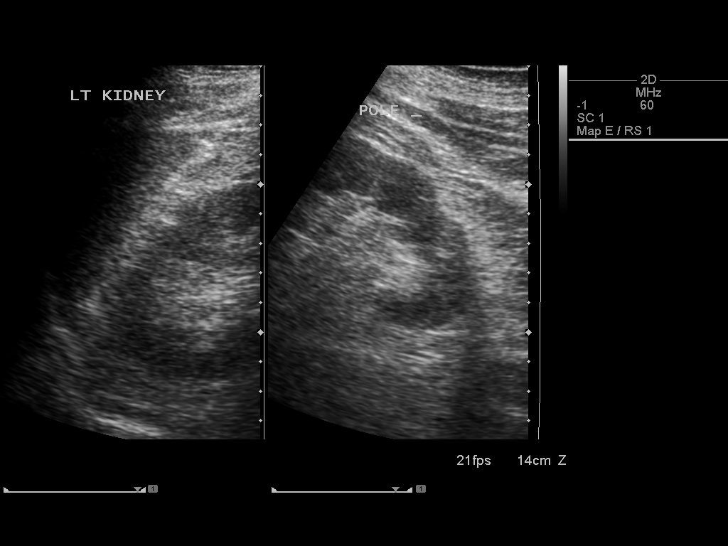
[im 24/27]
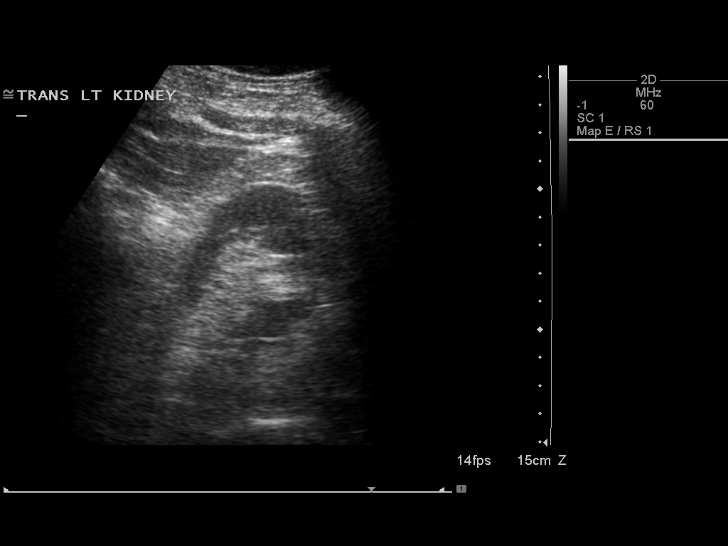
[im 27/27]
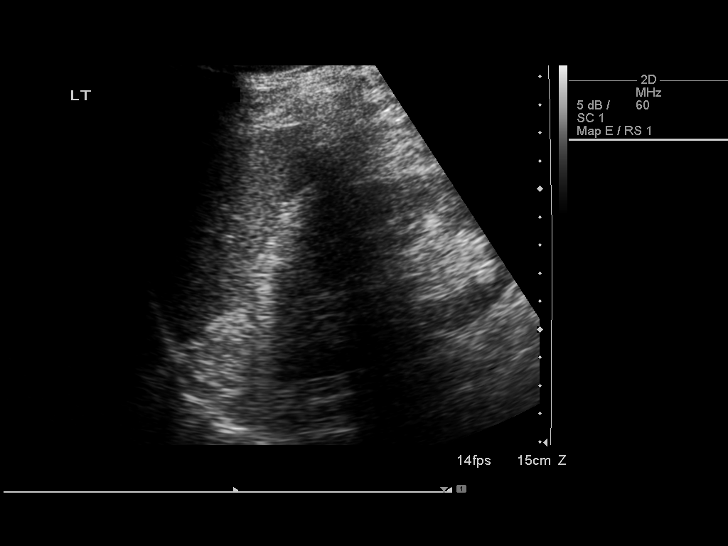

[14 of 25 positions shown; findings below may reference images not displayed]

FINDINGS: Right Kidney:

Length: 11.0 cm. Echogenicity within normal limits. No mass or
hydronephrosis visualized.

Left Kidney:

Length: 12.3 cm. Echogenicity within normal limits. No mass or
hydronephrosis visualized.

Bladder:

Appears normal for degree of bladder distention.
IMPRESSION: Negative exam.  Please see above.

This is a call report.

## 2014-12-14 ENCOUNTER — Encounter: Payer: Self-pay | Admitting: Family Medicine

## 2014-12-14 ENCOUNTER — Ambulatory Visit (INDEPENDENT_AMBULATORY_CARE_PROVIDER_SITE_OTHER): Payer: 59 | Admitting: Family Medicine

## 2014-12-14 VITALS — BP 180/88 | Temp 98.1°F | Wt 192.0 lb

## 2014-12-14 DIAGNOSIS — H02539 Eyelid retraction unspecified eye, unspecified lid: Secondary | ICD-10-CM

## 2014-12-14 DIAGNOSIS — E1140 Type 2 diabetes mellitus with diabetic neuropathy, unspecified: Secondary | ICD-10-CM

## 2014-12-14 DIAGNOSIS — I10 Essential (primary) hypertension: Secondary | ICD-10-CM

## 2014-12-14 DIAGNOSIS — E1149 Type 2 diabetes mellitus with other diabetic neurological complication: Secondary | ICD-10-CM

## 2014-12-14 DIAGNOSIS — H4900 Third [oculomotor] nerve palsy, unspecified eye: Secondary | ICD-10-CM

## 2014-12-14 DIAGNOSIS — R609 Edema, unspecified: Secondary | ICD-10-CM | POA: Insufficient documentation

## 2014-12-14 HISTORY — DX: Third (oculomotor) nerve palsy, unspecified eye: H49.00

## 2014-12-14 MED ORDER — HYDROCHLOROTHIAZIDE 12.5 MG PO TABS: ORAL_TABLET | ORAL | Status: DC

## 2014-12-14 NOTE — Patient Instructions (Signed)
Complete no salt diet  Elevate your legs at every opportunity  Hydrochlorothiazide 12.5 mg.........Marland Kitchen. 1 tablet Monday Wednesday Friday  Return in one week for follow-up  Call the eye doctor.......Marland Kitchen. Dr. Nelson Chimesonald [facility] phone number [phone] and make an appointment to be seen ASAP about your lid lag

## 2014-12-14 NOTE — Progress Notes (Signed)
   Subjective:    Patient ID: Patrick Hernandez, male    DOB: Mar 08, 1956, 59 y.o.   MRN: [id number]  HPI Patrick Hernandez is a 59 year old [place] male who comes in today accompanied by his wife because he can't speak English  He has a long-standing history of multiple medical problems including diabetes type 1, hyperlipidemia, hypertension,  He's followed in endocrinology by Dr. Durel Saltshristine G. Last visit 2 months ago A1c was 7.5%  His blood pressure at home is normal on Norvasc 10 mg along with Avapro 150 mg daily. BP here today 180/88. Blood pressure at home 135/85.  When I walked in the room he has an obvious problem with his left eye. He cannot open his left eye. He tells me he went to see an ophthalmologist but he doesn't know who it was. He said they told him they couldn't find the moment his eye but they never sent him to anywhere else for consultation. He has no symptoms of stroke or Bell's palsy.  He's also on the last month having trouble with swelling of his legs. Weight is 192 pounds   Review of Systems Review of systems otherwise negative    Objective:   Physical Exam  Well-developed well-nourished male no acute distress vital signs stable he is afebrile except for elevated blood pressure,,,,,,,, however wife says blood pressure at home is normal  Blood pressure Dr. Wynona Caneshristine G's office in November 2015 normal  He has an obvious lid lag left eye and cannot open his left eye  He has 2+ pedal edema bilaterally      Assessment & Plan:  Diabetes type 1 managed by Dr. Durel Saltshristine G  Hypertension....... now peripheral edema......... continue Norvasc ,,,,,,,,,,, creatinine 1.6 GFR 49 therefore we need to be very gentle about his diuresis. Stress no salt diet Hydrocort thiazide 12.5 mg Monday Wednesday Friday follow-up in one week  Lid lag left eye etiology unknown refer to Dr.  Hazle Quantigby for consultation

## 2014-12-15 ENCOUNTER — Other Ambulatory Visit: Payer: Self-pay | Admitting: Specialist

## 2014-12-15 ENCOUNTER — Other Ambulatory Visit: Payer: Self-pay | Admitting: *Deleted

## 2014-12-15 ENCOUNTER — Telehealth: Payer: Self-pay | Admitting: *Deleted

## 2014-12-15 ENCOUNTER — Other Ambulatory Visit: Payer: Self-pay | Admitting: Optometry

## 2014-12-15 ENCOUNTER — Other Ambulatory Visit: Payer: Self-pay | Admitting: Internal Medicine

## 2014-12-15 DIAGNOSIS — H579 Unspecified disorder of eye and adnexa: Secondary | ICD-10-CM

## 2014-12-15 DIAGNOSIS — H4902 Third [oculomotor] nerve palsy, left eye: Secondary | ICD-10-CM

## 2014-12-15 LAB — CBC AND DIFFERENTIAL
HEMATOCRIT: 38 % — AB (ref 41–53)
HEMOGLOBIN: 13.4 g/dL — AB (ref 13.5–17.5)
Platelets: 282 10*3/uL (ref 150–399)
WBC: 8.7 10^3/mL

## 2014-12-15 LAB — BASIC METABOLIC PANEL
BUN: 28 mg/dL — AB (ref 4–21)
CREATININE: 1.4 mg/dL — AB (ref <=1.3)

## 2014-12-15 NOTE — Telephone Encounter (Signed)
[facility]el [facility] (ph#[phone]) called and wanted to let Dr Tawanna Coolerodd know she is seeing the pt and he has a complete cranial nerve palsy, questioned if this could be vascular and ordered an MRI, CBC and sed rate and questioned if he had labs yesterday.  I advised her no lab tests were ordered yesterday and the last hemoglobin A1C was ordered by Dr Elvera LennoxGherghe.  Dr Allena KatzPatel asked that Dr Tawanna Coolerodd return her call on Monday.

## 2014-12-18 NOTE — Telephone Encounter (Signed)
Dr Tawanna Coolerodd spoke with Dr Allena KatzPatel. FYI.

## 2014-12-21 ENCOUNTER — Ambulatory Visit: Payer: 59 | Admitting: Family Medicine

## 2014-12-22 ENCOUNTER — Ambulatory Visit: Payer: BC Managed Care – PPO | Admitting: Internal Medicine

## 2014-12-22 ENCOUNTER — Telehealth: Payer: Self-pay | Admitting: Internal Medicine

## 2014-12-22 DIAGNOSIS — H4902 Third [oculomotor] nerve palsy, left eye: Secondary | ICD-10-CM

## 2014-12-22 NOTE — Telephone Encounter (Signed)
Received call [facility].  Mr Patrick Hernandez underwent MRI of brain.  It was negative.  Pt has diabetic retinopathy.  Patient's 3 rd nerve palsy may have vascular origin.  We agreed to proceed with carotid doppler.  Cindy,  Please contact pt and let him know his 3rd cranial nerve palsy likely secondary to diabetes.  We are going to order carotid doppler to rule out vascular cause.  [facility] results of carotid doppler

## 2014-12-25 NOTE — Telephone Encounter (Signed)
Pt does not speak English fluently and requested that call his daughter at [phone].  Left message on daughter's cell phone to call back

## 2014-12-26 NOTE — Telephone Encounter (Signed)
Please call pt daughter at [phone] ok to leave detail message

## 2014-12-26 NOTE — Telephone Encounter (Signed)
Left detailed message on pts cell phone with Dr Olegario MessierYoo's recommendations

## 2014-12-28 ENCOUNTER — Encounter: Payer: Self-pay | Admitting: Family Medicine

## 2014-12-28 ENCOUNTER — Ambulatory Visit (INDEPENDENT_AMBULATORY_CARE_PROVIDER_SITE_OTHER): Payer: 59 | Admitting: Family Medicine

## 2014-12-28 VITALS — BP 120/72 | HR 94 | Temp 97.8°F | Ht 69.5 in | Wt 188.2 lb

## 2014-12-28 DIAGNOSIS — H02539 Eyelid retraction unspecified eye, unspecified lid: Secondary | ICD-10-CM

## 2014-12-28 DIAGNOSIS — I10 Essential (primary) hypertension: Secondary | ICD-10-CM

## 2014-12-28 NOTE — Progress Notes (Signed)
Pre visit review using our clinic review tool, if applicable. No additional management support is needed unless otherwise documented below in the visit note. 

## 2014-12-28 NOTE — Patient Instructions (Signed)
Please take all medication daily  I will send a message to Dr. Artist PaisYoo to clarify what the next step is with your eye issue  Seek care immediately if any new of changing symptoms  Follow up with Dr. Artist PaisYoo in 1 month

## 2014-12-28 NOTE — Progress Notes (Signed)
HPI:  Follow up:   HTN:  -was elevated at last visit with Dr. Tawanna Coolerodd -Dr. Tawanna Coolerodd put him on hctz and now this has resolved - of note: had L lid lag/3rd nerve palsy of acute onset 3 weeks ago -has been evaluated by 2 opthalmologist and per phone notes had negative MRI and appears PCP, Dr. Artist PaisYoo discussed with his opthomologist on the phone and feels may be vascular in origin and per notes they are getting dopplers but he does not know when -denies: CP, SOB, DOE, palpitations, speech difficulty, cog impairment  ROS: See pertinent positives and negatives per HPI.  Past Medical History  Diagnosis Date  . Hypertension   . Palpitations   . Type II or unspecified type diabetes mellitus with neurological manifestations, not stated as uncontrolled   . Polyneuropathy in diabetes(357.2)     Past Surgical History  Procedure Laterality Date  . None      Family History  Problem Relation Age of Onset  . Diabetes Mother   . Diabetes Father   . Heart disease Father     Pacemaker    History   Social History  . Marital Status: Married    Spouse Name: N/A  . Number of Children: N/A  . Years of Education: N/A   Occupational History  . Self employed    Social History Main Topics  . Smoking status: Never Smoker   . Smokeless tobacco: Not on file  . Alcohol Use: No  . Drug Use: No  . Sexual Activity: Not on file   Other Topics Concern  . None   Social History Narrative   Married   Self employed - [employer]     Current outpatient prescriptions:  .  amLODipine (NORVASC) 10 MG tablet, TAKE 1 TABLET (10 MG TOTAL) BY MOUTH DAILY., Disp: 90 tablet, Rfl: 0 .  aspirin 81 MG tablet, Take 81 mg by mouth daily., Disp: , Rfl:  .  atorvastatin (LIPITOR) 40 MG tablet, Take 0.5 tablets (20 mg total) by mouth daily at 6 PM., Disp: 90 tablet, Rfl: 0 .  BAYER CONTOUR NEXT TEST test strip, 1 EACH BY OTHER ROUTE 3 (THREE) TIMES DAILY. USE AS INSTRUCTED, Disp: 100 each, Rfl: 2 .  BD PEN  NEEDLE NANO U/F 32G X 4 MM MISC, 1 STICK 4 (FOUR) TIMES DAILY., Disp: 100 each, Rfl: 10 .  dexamethasone (DECADRON) 1 MG tablet, Take 11 PM (night before AM blood test), Disp: 1 tablet, Rfl: 0 .  hydrochlorothiazide (HYDRODIURIL) 12.5 MG tablet, 1 tablet in the morning Monday Wednesday and Friday, Disp: 90 tablet, Rfl: 3 .  irbesartan (AVAPRO) 150 MG tablet, TAKE 1 TABLET (150 MG TOTAL) BY MOUTH DAILY., Disp: 90 tablet, Rfl: 0 .  LANTUS SOLOSTAR 100 UNIT/ML Solostar Pen, INJECT 0.3 MLS (30 UNITS TOTAL) INTO THE SKIN AT BEDTIME., Disp: 15 mL, Rfl: 4 .  metFORMIN (GLUCOPHAGE) 500 MG tablet, Take 1 tablet in am with b'fast and 2 tablets in pm with dinner, Disp: 270 tablet, Rfl: 1 .  metoprolol succinate (TOPROL-XL) 100 MG 24 hr tablet, TAKE 1 TABLET (100 MG TOTAL) BY MOUTH DAILY. TAKE WITH OR IMMEDIATELYFOLLOWING A MEAL., Disp: 90 tablet, Rfl: 0 .  NOVOLOG FLEXPEN 100 UNIT/ML FlexPen, INJECT 15 UNITS INTO THE SKIN 2 (TWO) TIMES DAILY BEFORE A MEAL., Disp: 15 mL, Rfl: 2 .  omeprazole-sodium bicarbonate (ZEGERID) 40-1100 MG per capsule, TAKE 1 CAPSULE BY MOUTH DAILY BEFORE BREAKFAST., Disp: 30 capsule, Rfl: 3 .  tamsulosin (  FLOMAX) 0.4 MG CAPS capsule, TAKE 1 CAPSULE (0.4 MG TOTAL) BY MOUTH DAILY AFTER SUPPER., Disp: 30 capsule, Rfl: 5  EXAM:  Filed Vitals:   12/28/14 1509  BP: 120/72  Pulse: 94  Temp: 97.8 F (36.6 C)    Body mass index is 27.4 kg/(m^2).  GENERAL: vitals reviewed and listed above, alert, oriented, appears well hydrated and in no acute distress  HEENT: atraumatic, conjunttiva clear, L eye with lid lag, no obvious abnormalities on inspection of external nose and ears, no facial droop  NECK: no obvious masses on inspection, no carotid bruit on my exam today  LUNGS: clear to auscultation bilaterally, no wheezes, rales or rhonchi, good air movement  CV: HRRR, no peripheral edema  MS: moves all extremities without noticeable abnormality  PSYCH: pleasant and cooperative, no  obvious depression or anxiety  NEURO: speech and cog grossly intact, though difficult to assess with language barrier, CN II-XII grossly intact except for no medial movement of L eye, lid droop L eyelid, gait normal, finger to nose normal  ASSESSMENT AND PLAN:  Discussed the following assessment and plan:  Essential hypertension  Lid lag  -BP well controlled today -advised to cont asa, statin, BP meds, lipids and diabetes meds and work to control diabetes with follow up with PCP -his dopplers are scheduled for tomorrow and provided this info through his interpreter and had my assistant call to get instructions for this appointment for him while he was here before he left -Patient advised to return or notify a doctor immediately if symptoms worsen or persist or new concerns arise.  There are no Patient Instructions on file for this visit.   Kriste Basque R.

## 2014-12-29 ENCOUNTER — Ambulatory Visit (HOSPITAL_COMMUNITY): Payer: 59 | Attending: Cardiovascular Disease | Admitting: *Deleted

## 2014-12-29 ENCOUNTER — Ambulatory Visit (HOSPITAL_COMMUNITY): Payer: 59

## 2014-12-29 DIAGNOSIS — E119 Type 2 diabetes mellitus without complications: Secondary | ICD-10-CM | POA: Diagnosis not present

## 2014-12-29 DIAGNOSIS — R51 Headache: Secondary | ICD-10-CM | POA: Insufficient documentation

## 2014-12-29 DIAGNOSIS — R42 Dizziness and giddiness: Secondary | ICD-10-CM | POA: Insufficient documentation

## 2014-12-29 DIAGNOSIS — I10 Essential (primary) hypertension: Secondary | ICD-10-CM | POA: Diagnosis not present

## 2014-12-29 DIAGNOSIS — E785 Hyperlipidemia, unspecified: Secondary | ICD-10-CM | POA: Diagnosis not present

## 2014-12-29 DIAGNOSIS — H4902 Third [oculomotor] nerve palsy, left eye: Secondary | ICD-10-CM

## 2014-12-29 NOTE — Progress Notes (Signed)
Carotid Duplex Scan Performed 

## 2014-12-30 ENCOUNTER — Other Ambulatory Visit: Payer: Self-pay | Admitting: Internal Medicine

## 2015-01-03 ENCOUNTER — Telehealth: Payer: Self-pay | Admitting: Internal Medicine

## 2015-01-03 LAB — SEDIMENTATION RATE: SED RATE: 6

## 2015-01-03 LAB — C-REACTIVE PROTEIN: CRP: 0.5 mg/dL

## 2015-01-03 NOTE — Telephone Encounter (Signed)
I called the pharmacy and was advised the patient received the atorvastatin and zegerid is a plan exclusion.  I can't submit a PA on plan exclusion since it will automatically be denied.  Patient will need to call their insurance company and see what is covered.

## 2015-01-03 NOTE — Telephone Encounter (Signed)
Pt has changed insurance companies [employer] and they do not cover his  omeprazole-sodium bicarbonate (ZEGERID) 40-1100 MG per capsule and atorvastatin (LIPITOR) 40 MG tablet Daughter states needs prior auth [employer] / [address]

## 2015-01-10 NOTE — Telephone Encounter (Signed)
Daughter will call insurance company and see what is covered and cb

## 2015-01-11 ENCOUNTER — Telehealth: Payer: Self-pay | Admitting: Internal Medicine

## 2015-01-11 NOTE — Telephone Encounter (Signed)
Pt daughter called and said insurance company said the following is covered atorvastatin (LIPITOR) 40 MG tablet   The following is not covered omeprazole-sodium bicarbonate (ZEGERID) 40-1100 MG per capsule and they gave her the following that is covered.  PANTOPRAZOLE

## 2015-01-15 ENCOUNTER — Encounter: Payer: Self-pay | Admitting: *Deleted

## 2015-01-15 MED ORDER — ATORVASTATIN CALCIUM 40 MG PO TABS: ORAL_TABLET | ORAL | Status: DC

## 2015-01-15 MED ORDER — PANTOPRAZOLE SODIUM 40 MG PO TBEC: 40.0000 mg | DELAYED_RELEASE_TABLET | Freq: Every day | ORAL | Status: DC

## 2015-01-15 NOTE — Telephone Encounter (Signed)
Please advise if ok to switch to Protonix

## 2015-01-15 NOTE — Telephone Encounter (Signed)
daughter aware dr Artist Paisyoo said ok for protonix [employer]/ [address]  Call her if need

## 2015-01-15 NOTE — Telephone Encounter (Signed)
rx's sent in electronically 

## 2015-01-15 NOTE — Telephone Encounter (Signed)
Ok to switch to protonix.

## 2015-01-19 ENCOUNTER — Ambulatory Visit: Payer: 59 | Admitting: Internal Medicine

## 2015-01-29 ENCOUNTER — Other Ambulatory Visit: Payer: Self-pay | Admitting: Internal Medicine

## 2015-01-31 ENCOUNTER — Other Ambulatory Visit: Payer: Self-pay | Admitting: *Deleted

## 2015-01-31 ENCOUNTER — Telehealth: Payer: Self-pay | Admitting: Internal Medicine

## 2015-01-31 MED ORDER — INSULIN LISPRO 100 UNIT/ML (KWIKPEN): PEN_INJECTOR | SUBCUTANEOUS | Status: DC

## 2015-01-31 NOTE — Telephone Encounter (Signed)
Pt daughter called and pt has new dose of meds and the pharmacy needs a PA on med please call her [phone]

## 2015-01-31 NOTE — Telephone Encounter (Signed)
Called pt's daughter and advised her that Dr Elvera LennoxGherghe switched his medication from Novolog to Humalog. New rx sent to pt's pharmacy.

## 2015-01-31 NOTE — Telephone Encounter (Signed)
Ins does not cover Novolog. Switching to Humalog.

## 2015-02-07 ENCOUNTER — Encounter: Payer: Self-pay | Admitting: Internal Medicine

## 2015-02-07 ENCOUNTER — Ambulatory Visit (INDEPENDENT_AMBULATORY_CARE_PROVIDER_SITE_OTHER): Payer: 59 | Admitting: Internal Medicine

## 2015-02-07 VITALS — BP 124/74 | HR 98 | Temp 98.8°F | Ht 69.5 in | Wt 191.0 lb

## 2015-02-07 DIAGNOSIS — Z23 Encounter for immunization: Secondary | ICD-10-CM | POA: Diagnosis not present

## 2015-02-07 DIAGNOSIS — H4902 Third [oculomotor] nerve palsy, left eye: Secondary | ICD-10-CM

## 2015-02-07 DIAGNOSIS — I10 Essential (primary) hypertension: Secondary | ICD-10-CM | POA: Diagnosis not present

## 2015-02-07 DIAGNOSIS — E1149 Type 2 diabetes mellitus with other diabetic neurological complication: Secondary | ICD-10-CM

## 2015-02-07 DIAGNOSIS — E1140 Type 2 diabetes mellitus with diabetic neuropathy, unspecified: Secondary | ICD-10-CM | POA: Diagnosis not present

## 2015-02-07 LAB — BASIC METABOLIC PANEL
BUN: 22 mg/dL (ref 6–23)
CO2: 28 mEq/L (ref 19–32)
CREATININE: 1.44 mg/dL (ref 0.40–1.50)
Calcium: 9 mg/dL (ref 8.4–10.5)
Chloride: 105 mEq/L (ref 96–112)
GFR: 53.52 mL/min — ABNORMAL LOW (ref >60.00)
Glucose, Bld: 200 mg/dL — ABNORMAL HIGH (ref 70–99)
POTASSIUM: 4.2 meq/L (ref 3.5–5.1)
SODIUM: 138 meq/L (ref 135–145)

## 2015-02-07 LAB — HEMOGLOBIN A1C: HEMOGLOBIN A1C: 8.8 % — AB (ref 4.6–6.5)

## 2015-02-07 LAB — MICROALBUMIN / CREATININE URINE RATIO
Creatinine,U: 421.1 mg/dL
MICROALB UR: 15.2 mg/dL — AB (ref 0.0–1.9)
MICROALB/CREAT RATIO: 3.6 mg/g (ref 0.0–30.0)

## 2015-02-07 MED ORDER — METFORMIN HCL 500 MG PO TABS: ORAL_TABLET | ORAL | Status: DC

## 2015-02-07 MED ORDER — IRBESARTAN 150 MG PO TABS: ORAL_TABLET | ORAL | Status: DC

## 2015-02-07 MED ORDER — METOPROLOL SUCCINATE ER 100 MG PO TB24: ORAL_TABLET | ORAL | Status: DC

## 2015-02-07 MED ORDER — AMLODIPINE BESYLATE 10 MG PO TABS
ORAL_TABLET | ORAL | Status: DC
Start: 2015-02-07 — End: 2015-11-15

## 2015-02-07 NOTE — Assessment & Plan Note (Signed)
Patient referred to ophthalmologist for evaluation. He was diagnosed with third cranial palsy. MRI of brain was negative. Carotid Doppler was also unremarkable. Cranial nerve palsy likely related to long history of poorly controlled type 2 diabetes. Fortunately his symptoms have significantly improved.

## 2015-02-07 NOTE — Progress Notes (Signed)
Pre visit review using our clinic review tool, if applicable. No additional management support is needed unless otherwise documented below in the visit note. 

## 2015-02-07 NOTE — Assessment & Plan Note (Addendum)
Patient has been working with endocrinologist. Blood sugar control has improved. Monitor A1c.  Lab Results  Component Value Date   HGBA1C 7.5* 09/15/2014   HGBA1C 8.3* 02/27/2014   HGBA1C 7.9* 10/12/2013   Lab Results  Component Value Date   MICROALBUR 21.0* 02/02/2013   LDLCALC 56 09/15/2014   CREATININE 1.4* 12/15/2014

## 2015-02-07 NOTE — Progress Notes (Signed)
Subjective:    Patient ID: Patrick Hernandez, male    DOB: [birth year], 59 y.o.   MRN: [id number]  HPI  59 year old Asian male with history of type 2 diabetes, difficult to control hypertension and recent cranial nerve palsy for follow up.  Patient seen by ophthalmologist who diagnosed third cranial nerve palsy. His MRI of brain was negative. There was concern for possible vascular etiology. Carotid duplex was ordered. It was negative for significant carotid artery stenosis.   Fortunately patient's left eye symptoms significantly improved. Patient reports it is 85% better. He still has mild diplopia.  Type 2 diabetes-blood sugar control significantly improved.  Hypertension-blood pressure also at goal since adding hydrochlorothiazide 12.5 mg. He is due for follow-up BMET.  History obtained through 3 and translator.  Review of Systems Negative for chest pain, negative for shortness of breath    Past Medical History  Diagnosis Date  . Hypertension   . Palpitations   . Type II or unspecified type diabetes mellitus with neurological manifestations, not stated as uncontrolled   . Polyneuropathy in diabetes(357.2)     History   Social History  . Marital Status: Married    Spouse Name: N/A  . Number of Children: N/A  . Years of Education: N/A   Occupational History  . Self employed    Social History Main Topics  . Smoking status: Never Smoker   . Smokeless tobacco: Not on file  . Alcohol Use: No  . Drug Use: No  . Sexual Activity: Not on file   Other Topics Concern  . 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 None      Family History  Problem Relation Age of Onset  . Diabetes Mother   . Diabetes Father   . Heart disease Father     Pacemaker    No Known Allergies  Current Outpatient Prescriptions on File Prior to Visit  Medication Sig Dispense Refill  .  amLODipine (NORVASC) 10 MG tablet TAKE 1 TABLET (10 MG TOTAL) BY MOUTH DAILY. 90 tablet 0  . aspirin 81 MG tablet Take 81 mg by mouth daily.    Marland Kitchen. atorvastatin (LIPITOR) 40 MG tablet TAKE 1 TABLET (40 MG TOTAL) BY MOUTH DAILY. 90 tablet 1  . BAYER CONTOUR NEXT TEST test strip 1 EACH BY OTHER ROUTE 3 (THREE) TIMES DAILY. USE AS INSTRUCTED 100 each 2  . BD PEN NEEDLE NANO U/F 32G X 4 MM MISC 1 STICK 4 (FOUR) TIMES DAILY. 100 each 10  . dexamethasone (DECADRON) 1 MG tablet Take 11 PM (night before AM blood test) 1 tablet 0  . hydrochlorothiazide (HYDRODIURIL) 12.5 MG tablet 1 tablet in the morning Monday Wednesday and Friday 90 tablet 3  . insulin lispro (HUMALOG KWIKPEN) 100 UNIT/ML KiwkPen Inject into the skin, 10 units with a smaller meal, 13 units with a regular meal and 15 units with a larger meal, 3 times daily  as instructed. 15 mL 2  . irbesartan (AVAPRO) 150 MG tablet TAKE 1 TABLET (150 MG TOTAL) BY MOUTH DAILY. 90 tablet 0  . LANTUS SOLOSTAR 100 UNIT/ML Solostar Pen INJECT 0.3 MLS (30 UNITS TOTAL) INTO THE SKIN AT BEDTIME. 15 mL 4  . metFORMIN (GLUCOPHAGE) 500 MG tablet Take 1 tablet in am with b'fast and 2 tablets in pm with dinner 270 tablet 1  . metoprolol succinate (  TOPROL-XL) 100 MG 24 hr tablet TAKE 1 TABLET (100 MG TOTAL) BY MOUTH DAILY. TAKE WITH OR IMMEDIATELYFOLLOWING A MEAL. 90 tablet 0  . NOVOLOG FLEXPEN 100 UNIT/ML FlexPen INJECT 15 UNITS INTO THE SKIN 2 (TWO) TIMES DAILY BEFORE A MEAL. 15 mL 2  . pantoprazole (PROTONIX) 40 MG tablet Take 1 tablet (40 mg total) by mouth daily. 90 tablet 1  . tamsulosin (FLOMAX) 0.4 MG CAPS capsule TAKE 1 CAPSULE (0.4 MG TOTAL) BY MOUTH DAILY AFTER SUPPER. 30 capsule 5   No current facility-administered medications on file prior to visit.    BP 124/74 mmHg  Pulse 98  Temp(Src) 98.8 F (37.1 C) (Oral)  Ht 5' 9.5" (1.765 m)  Wt 191 lb (86.637 kg)  BMI 27.81 kg/m2    Objective:   Physical Exam  Constitutional: He is oriented to person,  place, and time. He appears well-developed and well-nourished. No distress.  HENT:  Head: Normocephalic and atraumatic.  Mouth/Throat: Oropharynx is clear and moist.  Eyes: Pupils are equal, round, and reactive to light.  Minimal left lid lag, very mild defect in conjugate gaze left eye  Neck: Neck supple.  No carotid bruit  Cardiovascular: Normal rate, regular rhythm and normal heart sounds.   No murmur heard. Pulmonary/Chest: Effort normal and breath sounds normal. He has no wheezes.  Musculoskeletal: He exhibits no edema.  Neurological: He is alert and oriented to person, place, and time. No cranial nerve deficit.  Psychiatric: He has a normal mood and affect. His behavior is normal.          Assessment & Plan:

## 2015-02-07 NOTE — Assessment & Plan Note (Signed)
BP at goal.  Monitor electrolytes and kidney function. BP: 124/74 mmHg

## 2015-02-20 ENCOUNTER — Other Ambulatory Visit: Payer: Self-pay | Admitting: Internal Medicine

## 2015-02-23 ENCOUNTER — Ambulatory Visit: Payer: 59 | Admitting: Internal Medicine

## 2015-02-26 ENCOUNTER — Other Ambulatory Visit: Payer: Self-pay | Admitting: *Deleted

## 2015-02-26 ENCOUNTER — Ambulatory Visit (INDEPENDENT_AMBULATORY_CARE_PROVIDER_SITE_OTHER): Payer: 59 | Admitting: Internal Medicine

## 2015-02-26 VITALS — BP 132/72 | HR 100 | Temp 99.0°F | Resp 12 | Wt 192.4 lb

## 2015-02-26 DIAGNOSIS — E1149 Type 2 diabetes mellitus with other diabetic neurological complication: Secondary | ICD-10-CM

## 2015-02-26 DIAGNOSIS — E1140 Type 2 diabetes mellitus with diabetic neuropathy, unspecified: Secondary | ICD-10-CM

## 2015-02-26 MED ORDER — ONETOUCH DELICA LANCETS FINE MISC: Status: DC

## 2015-02-26 MED ORDER — ONETOUCH VERIO FLEX SYSTEM W/DEVICE KIT: 1.0000 | PACK | Freq: Three times a day (TID) | Status: DC

## 2015-02-26 MED ORDER — GLUCOSE BLOOD VI STRP: ORAL_STRIP | Status: DC

## 2015-02-26 MED ORDER — INSULIN LISPRO 100 UNIT/ML (KWIKPEN): PEN_INJECTOR | SUBCUTANEOUS | Status: DC

## 2015-02-26 NOTE — Telephone Encounter (Signed)
Pt's ins requiring pt to use One Touch meters, strips and lancets. Sent to pt's pharmacy.

## 2015-02-26 NOTE — Progress Notes (Signed)
Patient ID: Patrick Hernandez, male   DOB: 1956/06/13, 59 y.o.   MRN: [id number]  HPI: Patrick Hernandez is a 59 y.o.-year-old male, returning for f/u for DM2, dx 2000, insulin-dependent since 2013, uncontrolled, without complications and HTN. Last visit 7 mo ago.  DM2: Last hemoglobin A1c was: Lab Results  Component Value Date   HGBA1C 8.8* 02/07/2015   HGBA1C 7.5* 09/15/2014   HGBA1C 8.3* 02/27/2014  Improvement in diet and starting to take the insulin consistently >> improved HbA1c.  Pt is on a regimen of: - Lantus 30 units qhs - Novolog before b'fast or dinner: - 10 units with a smaller meal - 13 units with a regular meal - 15 units with a larger meal He has been on Metformin. I believe this might have been stopped 2/2 CKD.  Pt checks his sugars 2-3x a day and they are better c/w 12/2014: - am: n/c >> 107-205 >> 107-189 (higher if eats a late dinner) >> 145-189 - 2-3h after b'fast: 80-180 >> 73-140 >> 95-128 >> 95-135, 181 - before lunch: n/c - 2h after lunch: n/c >> 135-165 >> n/c - before dinner: n/c >> 110-147, 206 (rice) >> 122-180 - 2h after dinner: n/c >> 54 (if takes 12 units NovoLog) - 170 >> 84-145 >> n/c - bedtime: n/c - nighttime: n/c No lows. Lowest sugar was 54 >> 84 >> 95 now; he has hypoglycemia awareness at 90.  Highest sugar was 206 >> 295  Pt's meals are: - Breakfast: brown rice, beans, soup (tofu, vegetables) - Lunch: same - Dinner: same, salads - Snacks: none  - Has CKD, last BUN/creatinine:  Lab Results  Component Value Date   BUN 22 02/07/2015   CREATININE 1.44 02/07/2015  On Avapro. - last set of lipids: Lab Results  Component Value Date   CHOL 109 09/15/2014   HDL 29.10* 09/15/2014   LDLCALC 56 09/15/2014   LDLDIRECT 74.4 02/02/2013   TRIG 122.0 09/15/2014   CHOLHDL 4 09/15/2014  On Lipitor. - last eye exam was in 01/2015. no DR. He had Laser Sx.in the past. - + numbness and tingling in his feet - 1rst-3rd toes bilaterally. He is on ASA  81.  I reviewed his chart and he also has a history of BPH, GERD, HTN.  I reviewed pt's medications, allergies, PMH, social hx, family hx, and changes were documented in the history of present illness. Otherwise, unchanged from my initial visit note.  ROS: Constitutional: no weight gain/loss, no fatigue, no subjective hyperthermia/hypothermia Eyes: no blurry vision, no xerophthalmia ENT: no sore throat, no nodules palpated in throat, no dysphagia/odynophagia, no hoarseness Cardiovascular: no CP/SOB/palpitations/+ leg swelling Respiratory: no cough/SOB Gastrointestinal: no N/V/D/C, no heartburn Musculoskeletal: no muscle/joint aches Skin: + rash R lower leg Neurological: no tremors/numbness/tingling/dizziness  PE: BP 132/72 mmHg  Pulse 100  Temp(Src) 99 F (37.2 C) (Oral)  Resp 12  Wt 192 lb 6.4 oz (87.272 kg)  SpO2 97% Body mass index is 28.01 kg/(m^2).  Wt Readings from Last 3 Encounters:  02/26/15 192 lb 6.4 oz (87.272 kg)  02/07/15 191 lb (86.637 kg)  12/28/14 188 lb 3.2 oz (85.367 kg)   Constitutional: overweight, in NAD Eyes: PERRLA, EOMI, no exophthalmos ENT: moist mucous membranes, no thyromegaly, no cervical lymphadenopathy Cardiovascular: RRR, No MRG, + pitting bilat LE swelling Respiratory: CTA B Gastrointestinal: abdomen soft, NT, ND, BS+ Musculoskeletal: no deformities, strength intact in all 4 Skin: moist, warm, no rashes Neurological: no tremor with outstretched hands, DTR normal in  all 4  ASSESSMENT: 1. DM2, insulin-dependent, uncontrolled, without complications - ? PAD (decreased pulses in LE)  PLAN:  1. Patient with long-standing, recently more controlled diabetes, on basal-bolus insulin regimen + metformin. He now checks sugars 2-3x a day and the readings are improved c/w 12/2014. He has lower sugars after b'fast >> may feel weak then >> will reduce the insulin with that meal. Will also add 6 units with lunch as sugars before dinner are higher than  goal. I advised him to check some sugars at bedtime. Will also advise him to increase Metformin to 2 tabs at dinnertime as he did not do this. Sugars in am are higher so we also may need to increase Lantus at next visit.  - I suggested to:  Patient Instructions  Continue Metformin but increase 500 mg in am and 1000 mg with dinner Continue Lantus 30 units at bedtime. Change NovoLog as follows: - 12 units with breakfast - 6 units with lunch - 15 units with dinner  Please return in 1 month with your sugar log.   Check sugars 2-3x a day. Check some sugars at bedtime.  - continue checking sugars at different times of the day - check 2-3 times a day, but mostly before meals, rotating checks - given new sugar logs - UTD yearly eye exams - Return to clinic in 1 mo with sugar log   Contact:  Lafonda Mosses (daughter) - in [facility]: [phone]

## 2015-02-26 NOTE — Patient Instructions (Signed)
Continue Metformin but increase 500 mg in am and 1000 mg with dinner Continue Lantus 30 units at bedtime. Change NovoLog as follows: - 12 units with breakfast - 6 units with lunch - 15 units with dinner  Please stop at the lab.  Please return in 1 month with your sugar log.   Check sugars 2-3x a day. Check some sugars at bedtime.

## 2015-02-27 ENCOUNTER — Encounter: Payer: Self-pay | Admitting: Internal Medicine

## 2015-03-28 ENCOUNTER — Encounter: Payer: Self-pay | Admitting: Internal Medicine

## 2015-03-28 ENCOUNTER — Ambulatory Visit (INDEPENDENT_AMBULATORY_CARE_PROVIDER_SITE_OTHER): Payer: 59 | Admitting: Internal Medicine

## 2015-03-28 VITALS — BP 140/78 | HR 83 | Temp 98.1°F | Resp 18 | Wt 193.0 lb

## 2015-03-28 DIAGNOSIS — E1140 Type 2 diabetes mellitus with diabetic neuropathy, unspecified: Secondary | ICD-10-CM | POA: Diagnosis not present

## 2015-03-28 DIAGNOSIS — E1149 Type 2 diabetes mellitus with other diabetic neurological complication: Secondary | ICD-10-CM

## 2015-03-28 MED ORDER — INSULIN GLARGINE 100 UNIT/ML SOLOSTAR PEN: PEN_INJECTOR | SUBCUTANEOUS | Status: DC

## 2015-03-28 NOTE — Progress Notes (Signed)
Patient ID: Patrick Hernandez, male   DOB: May 25, 1956, 59 y.o.   MRN: [id number]  HPI: Patrick Hernandez is a 59 y.o.-year-old male, returning for f/u for DM2, dx 2000, insulin-dependent since 2013, uncontrolled, without complications and HTN. Last visit 1 mo ago.  DM2: Last hemoglobin A1c was: Lab Results  Component Value Date   HGBA1C 8.8* 02/07/2015   HGBA1C 7.5* 09/15/2014   HGBA1C 8.3* 02/27/2014  Improvement in diet and starting to take the insulin consistently >> improved HbA1c.  Pt is on a regimen of: - Metformin 500 mg in am and 1000 mg in pm - Lantus 30 units qhs - Novolog before b'fast or dinner: - 13-15 units with breakfast - 6 units with lunch - 15 units with dinner  Pt checks his sugars 2-3x a day and they are: - am: n/c >> 107-205 >> 107-189 (higher if eats a late dinner) >> 145-189 >> 126, 140-200 - 2-3h after b'fast: 80-180 >> 73-140 >> 95-128 >> 95-135, 181 >> 73-164 - before lunch: n/c - 2h after lunch: n/c >> 135-165 >> n/c - before dinner: n/c >> 110-147, 206 (rice) >> 122-180 >> 83-167, 199 (~on sugars after b'fast) - 2h after dinner: n/c >> 54 (if takes 12 units NovoLog) - 170 >> 84-145 >> n/c - bedtime: n/c >> 101-150, 175 - nighttime: n/c No lows. Lowest sugar was 54 >> 84 >> 95 now; he has hypoglycemia awareness at 90.  Highest sugar was 206 >> 295  Pt's meals are: - Breakfast: brown rice, beans, soup (tofu, vegetables) - Lunch: same - Dinner: same, salads - Snacks: none  - Has CKD, last BUN/creatinine:  Lab Results  Component Value Date   BUN 22 02/07/2015   CREATININE 1.44 02/07/2015  On Avapro. - last set of lipids: Lab Results  Component Value Date   CHOL 109 09/15/2014   HDL 29.10* 09/15/2014   LDLCALC 56 09/15/2014   LDLDIRECT 74.4 02/02/2013   TRIG 122.0 09/15/2014   CHOLHDL 4 09/15/2014  On Lipitor. - last eye exam was in 01/2015. no DR. He had Laser Sx.in the past. - + numbness and tingling in his feet - 1rst-3rd toes bilaterally. He is  on ASA 81.  I reviewed his chart and he also has a history of BPH, GERD, HTN.  I reviewed pt's medications, allergies, PMH, social hx, family hx, and changes were documented in the history of present illness. Otherwise, unchanged from my initial visit note.  ROS: Constitutional: no weight gain/loss, no fatigue, no subjective hyperthermia/hypothermia Eyes: no blurry vision, no xerophthalmia ENT: no sore throat, no nodules palpated in throat, no dysphagia/odynophagia, no hoarseness Cardiovascular: no CP/SOB/palpitations/+ leg swelling Respiratory: no cough/SOB Gastrointestinal: no N/V/D/C, no heartburn Musculoskeletal: no muscle/joint aches Skin: no rashes Neurological: no tremors/numbness/tingling/dizziness  PE: BP 140/78 mmHg  Pulse 83  Temp(Src) 98.1 F (36.7 C) (Oral)  Resp 18  Wt 193 lb (87.544 kg)  SpO2 98% Body mass index is 28.1 kg/(m^2).   Wt Readings from Last 3 Encounters:  02/26/15 192 lb 6.4 oz (87.272 kg)  02/07/15 191 lb (86.637 kg)  12/28/14 188 lb 3.2 oz (85.367 kg)   Constitutional: overweight, in NAD Eyes: PERRLA, EOMI, no exophthalmos ENT: moist mucous membranes, no thyromegaly, no cervical lymphadenopathy Cardiovascular: RRR, No MRG, + mild pitting bilat LE swelling Respiratory: CTA B Gastrointestinal: abdomen soft, NT, ND, BS+ Musculoskeletal: no deformities, strength intact in all 4 Skin: moist, warm, no rashes Neurological: no tremor with outstretched hands, DTR normal in  all 4  ASSESSMENT: 1. DM2, insulin-dependent, uncontrolled, without complications - ? PAD (decreased pulses in LE)  PLAN:  1. Patient with long-standing, recently more controlled diabetes, on basal-bolus insulin regimen + metformin. Sugars still high in am and if this happens >> he can have higher sugars later in the day.  We will increase Lantus and back off a little from the mealime insulin. - I suggested to:  Patient Instructions  Please continue: - Metformin 500 mg in am  and 1000 mg in pm  Increase:  - Lantus 35 units qhs  Decrease: - Novolog before b'fast or dinner: - 12 units with breakfast - 6 units with lunch - 12 units with dinner  Please return in 3 months with your sugar log.   - continue checking sugars at different times of the day - check 2-3 times a day, but mostly before meals, rotating checks - given new sugar logs - UTD yearly eye exams - Return to clinic in 3 mo with sugar log   Contact:  Lafonda MossesDiana (daughter) - [facility]: [phone]

## 2015-03-28 NOTE — Patient Instructions (Signed)
Please continue: - Metformin 500 mg in am and 1000 mg in pm  Increase:  - Lantus 35 units qhs  Decrease: - Novolog before b'fast or dinner: - 12 units with breakfast - 6 units with lunch - 12 units with dinner  Please return in 3 months with your sugar log.

## 2015-05-07 ENCOUNTER — Other Ambulatory Visit: Payer: Self-pay | Admitting: Internal Medicine

## 2015-06-01 ENCOUNTER — Other Ambulatory Visit: Payer: Self-pay | Admitting: Internal Medicine

## 2015-06-19 ENCOUNTER — Other Ambulatory Visit: Payer: Self-pay | Admitting: Internal Medicine

## 2015-06-28 ENCOUNTER — Ambulatory Visit: Payer: 59 | Admitting: Internal Medicine

## 2015-07-11 ENCOUNTER — Other Ambulatory Visit: Payer: Self-pay | Admitting: Internal Medicine

## 2015-07-16 ENCOUNTER — Other Ambulatory Visit: Payer: Self-pay | Admitting: Internal Medicine

## 2015-08-07 ENCOUNTER — Other Ambulatory Visit: Payer: Self-pay | Admitting: *Deleted

## 2015-08-07 MED ORDER — IRBESARTAN 150 MG PO TABS: ORAL_TABLET | ORAL | Status: DC

## 2015-08-09 ENCOUNTER — Ambulatory Visit (INDEPENDENT_AMBULATORY_CARE_PROVIDER_SITE_OTHER): Payer: 59 | Admitting: Internal Medicine

## 2015-08-09 ENCOUNTER — Encounter: Payer: Self-pay | Admitting: Internal Medicine

## 2015-08-09 ENCOUNTER — Other Ambulatory Visit (INDEPENDENT_AMBULATORY_CARE_PROVIDER_SITE_OTHER): Payer: 59 | Admitting: *Deleted

## 2015-08-09 VITALS — BP 132/64 | HR 96 | Temp 98.2°F | Resp 12 | Wt 197.0 lb

## 2015-08-09 DIAGNOSIS — Z23 Encounter for immunization: Secondary | ICD-10-CM

## 2015-08-09 DIAGNOSIS — E1140 Type 2 diabetes mellitus with diabetic neuropathy, unspecified: Secondary | ICD-10-CM | POA: Diagnosis not present

## 2015-08-09 DIAGNOSIS — E1149 Type 2 diabetes mellitus with other diabetic neurological complication: Secondary | ICD-10-CM

## 2015-08-09 LAB — POCT GLYCOSYLATED HEMOGLOBIN (HGB A1C): HEMOGLOBIN A1C: 7.1

## 2015-08-09 MED ORDER — METFORMIN HCL 500 MG PO TABS: ORAL_TABLET | ORAL | Status: DC

## 2015-08-09 MED ORDER — INSULIN GLARGINE 100 UNIT/ML SOLOSTAR PEN: PEN_INJECTOR | SUBCUTANEOUS | Status: DC

## 2015-08-09 MED ORDER — INSULIN LISPRO 100 UNIT/ML (KWIKPEN): PEN_INJECTOR | SUBCUTANEOUS | Status: DC

## 2015-08-09 NOTE — Progress Notes (Signed)
Patient ID: Patrick Hernandez, male   DOB: Jul 07, 1956, 59 y.o.   MRN: [id number]  HPI: Patrick Hernandez is a 59 y.o.-year-old male, returning for f/u for DM2, dx 2000, insulin-dependent since 2013, uncontrolled, without complications and HTN. Last visit 4 mo ago.  DM2: Last hemoglobin A1c was: Lab Results  Component Value Date   HGBA1C 8.8* 02/07/2015   HGBA1C 7.5* 09/15/2014   HGBA1C 8.3* 02/27/2014  Improvement in diet and starting to take the insulin consistently >> improved HbA1c.  Pt is on a regimen of: - Metformin 500 mg in am and 1000 mg in pm - Lantus 35 units qhs - Novolog before b'fast or dinner: - 12 >> 15 units with breakfast - 6 units with lunch - 12 >> 15 units with dinner  Pt checks his sugars 2-3x a day and they are high in am: - am: n/c >> 107-205 >> 107-189 (higher if eats a late dinner) >> 145-189 >> 126, 140-200 >> 162-209 - 2-3h after b'fast: 80-180 >> 73-140 >> 95-128 >> 95-135, 181 >> 73-164 >. 88-179, 195 - before lunch: n/c - 2h after lunch: n/c >> 135-165 >> n/c >> 116-172 - before dinner: n/c >> 110-147, 206 (rice) >> 122-180 >> 83-167, 199 >> n/c - 2h after dinner: n/c >> 54 (if takes 12 units NovoLog) - 170 >> 84-145 >> n/c - bedtime: n/c >> 101-150, 175 >> n/c - nighttime: n/c No lows. Lowest sugar was 54 >> 84 >> 95 >> 88; he has hypoglycemia awareness at 90.  Highest sugar was 206 >> 295 ?>> 202  Pt's meals are: - Breakfast: brown rice, beans, soup (tofu, vegetables) - Lunch: same - Dinner: same, salads - Snacks: none  - Has CKD, last BUN/creatinine:  Lab Results  Component Value Date   BUN 22 02/07/2015   CREATININE 1.44 02/07/2015  On Avapro. - last set of lipids: Lab Results  Component Value Date   CHOL 109 09/15/2014   HDL 29.10* 09/15/2014   LDLCALC 56 09/15/2014   LDLDIRECT 74.4 02/02/2013   TRIG 122.0 09/15/2014   CHOLHDL 4 09/15/2014  On Lipitor. - last eye exam was in 01/2015. no DR. He had Laser Sx.in the past. - + numbness and  tingling in his feet - 1rst-3rd toes bilaterally. He is on ASA 81.  I reviewed his chart and he also has a history of BPH, GERD, HTN.  I reviewed pt's medications, allergies, PMH, social hx, family hx, and changes were documented in the history of present illness. Otherwise, unchanged from my initial visit note.  ROS: Constitutional: no weight gain/loss, no fatigue, no subjective hyperthermia/hypothermia Eyes: no blurry vision, no xerophthalmia ENT: no sore throat, no nodules palpated in throat, no dysphagia/odynophagia, no hoarseness Cardiovascular: no CP/SOB/palpitations/leg swelling Respiratory: no cough/SOB Gastrointestinal: no N/V/D/C, no heartburn Musculoskeletal: no muscle/joint aches Skin: no rashes Neurological: no tremors/numbness/tingling/dizziness  PE: BP 132/64 mmHg  Pulse 96  Temp(Src) 98.2 F (36.8 C) (Oral)  Resp 12  Wt 197 lb (89.359 kg)  SpO2 98% Body mass index is 28.68 kg/(m^2).   Wt Readings from Last 3 Encounters:  08/09/15 197 lb (89.359 kg)  03/28/15 193 lb (87.544 kg)  02/26/15 192 lb 6.4 oz (87.272 kg)   Constitutional: overweight, in NAD Eyes: PERRLA, EOMI, no exophthalmos ENT: moist mucous membranes, no thyromegaly, no cervical lymphadenopathy Cardiovascular: RRR, No MRG, no edema Respiratory: CTA B Gastrointestinal: abdomen soft, NT, ND, BS+ Musculoskeletal: no deformities, strength intact in all 4 Skin: moist, warm, no  rashes Neurological: no tremor with outstretched hands, DTR normal in all 4  ASSESSMENT: 1. DM2, insulin-dependent, uncontrolled, without complications - ? PAD (decreased pulses in LE)  PLAN:  1. Patient with long-standing, recently more controlled diabetes, on basal-bolus insulin regimen + metformin. His sugars are higher in am and then drop after his am NovoLog dose >> will increase Lantus and decrease Novolog in am. I also asked him to check sugars before bedtime, also. - I suggested to:  Patient Instructions  Please  change the insulin regimen as follows: - Increase Lantus to 38 units at bedtime.  - Decrease NovoLog to:  12 units before breakfast  6 units before lunch  15 units before dinner  Please return in 3 months with your sugar log.   - continue checking sugars at different times of the day - check 2-3 times a day, but mostly before meals, rotating checks - given new sugar logs - refilled all DM meds - UTD yearly eye exams - flu shot given today - checked Hba1c today >> 7.1% (improved!) - Return to clinic in 3 mo with sugar log   Contact:  Lafonda Mosses (daughter) - in [facility]: [phone]

## 2015-08-09 NOTE — Patient Instructions (Signed)
Please change the insulin regimen as follows: - Increase Lantus to 38 units at bedtime. - Decrease NovoLog to:  12 units before breakfast  6 units before lunch  15 units before dinner  Please return in 3 months with your sugar log.

## 2015-09-25 ENCOUNTER — Other Ambulatory Visit: Payer: Self-pay | Admitting: Internal Medicine

## 2015-10-13 ENCOUNTER — Other Ambulatory Visit: Payer: Self-pay | Admitting: Internal Medicine

## 2015-11-15 ENCOUNTER — Ambulatory Visit (INDEPENDENT_AMBULATORY_CARE_PROVIDER_SITE_OTHER): Payer: BLUE CROSS/BLUE SHIELD | Admitting: Internal Medicine

## 2015-11-15 ENCOUNTER — Encounter: Payer: Self-pay | Admitting: Internal Medicine

## 2015-11-15 ENCOUNTER — Other Ambulatory Visit (INDEPENDENT_AMBULATORY_CARE_PROVIDER_SITE_OTHER): Payer: BLUE CROSS/BLUE SHIELD | Admitting: *Deleted

## 2015-11-15 VITALS — BP 140/72 | HR 98 | Temp 98.0°F | Resp 12 | Wt 194.0 lb

## 2015-11-15 DIAGNOSIS — E1149 Type 2 diabetes mellitus with other diabetic neurological complication: Secondary | ICD-10-CM

## 2015-11-15 LAB — POCT GLYCOSYLATED HEMOGLOBIN (HGB A1C): Hemoglobin A1C: 6.7

## 2015-11-15 MED ORDER — INSULIN GLARGINE 100 UNIT/ML SOLOSTAR PEN: PEN_INJECTOR | SUBCUTANEOUS | Status: DC

## 2015-11-15 NOTE — Progress Notes (Signed)
Patient ID: Patrick Hernandez, male   DOB: 08/07/1956, 60 y.o.   MRN: [id number]  HPI: Patrick CromeHyun Kyu Wierzbicki is a 60 y.o.-year-old male, returning for f/u for DM2, dx 2000, insulin-dependent since 2013, uncontrolled, without complications and HTN. Last visit 3 mo ago.  DM2: Last hemoglobin A1c was: Lab Results  Component Value Date   HGBA1C 6.7 11/15/2015   HGBA1C 7.1 08/09/2015   HGBA1C 8.8* 02/07/2015  Improvement in diet and starting to take the insulin consistently >> improved HbA1c.  Pt is on a regimen of: - Metformin 500 mg in am and 1000 mg in pm - Lantus 30 units at bedtime (did not increase to 38 units as advised at last visit) - NovoLog:  12 units before breakfast  6 units before lunch (may skip!)  15 units before dinner  Pt checks his sugars 2-3x a day and they are high in am: - am: 107-189 (higher if eats a late dinner) >> 145-189 >> 126, 140-200 >> 162-209 >> 82-202 - 2-3h after b'fast: 80-180 >> 73-140 >> 95-128 >> 95-135, 181 >> 73-164 >> 88-179, 195 >> n/c - before lunch: n/c >> 80-166 - 2h after lunch: n/c >> 135-165 >> n/c >> 116-172 >> n/c - before dinner: n/c >> 110-147, 206 (rice) >> 122-180 >> 83-167, 199 >> n/c >> 89-165 - 2h after dinner: n/c >> 54 (if takes 12 units NovoLog) - 170 >> 84-145 >> n/c - bedtime: n/c >> 101-150, 175 >> n/c - nighttime: n/c No lows. Lowest sugar was 54 >> 84 >> 95 >> 88 >> 80; he has hypoglycemia awareness at 90.  Highest sugar was 206 >> 295 ?>> 202 >> 204  Pt's meals are: - Breakfast: brown rice, beans, soup (tofu, vegetables) - Lunch: same - Dinner: same, salads - Snacks: none  - Has CKD, last BUN/creatinine:  Lab Results  Component Value Date   BUN 22 02/07/2015   CREATININE 1.44 02/07/2015  On Avapro. - last set of lipids: Lab Results  Component Value Date   CHOL 109 09/15/2014   HDL 29.10* 09/15/2014   LDLCALC 56 09/15/2014   LDLDIRECT 74.4 02/02/2013   TRIG 122.0 09/15/2014   CHOLHDL 4 09/15/2014  On Lipitor. -  last eye exam was in 01/2015. no DR. He had Laser Sx in the past. - + numbness and tingling in his feet - 1rst-3rd toes bilaterally. He is on ASA 81.  He also has a history of BPH, GERD, HTN.  I reviewed pt's medications, allergies, PMH, social hx, family hx, and changes were documented in the history of present illness. Otherwise, unchanged from my initial visit note.  ROS: Constitutional: no weight gain/loss, no fatigue, no subjective hyperthermia/hypothermia Eyes: no blurry vision, no xerophthalmia ENT: no sore throat, no nodules palpated in throat, no dysphagia/odynophagia, no hoarseness Cardiovascular: no CP/SOB/palpitations/+ leg swelling Respiratory: no cough/SOB Gastrointestinal: no N/V/+ D/no C, no heartburn Musculoskeletal: no muscle/joint aches Skin: no rashes Neurological: no tremors/numbness/tingling/dizziness  PE: BP 140/72 mmHg  Pulse 98  Temp(Src) 98 F (36.7 C) (Oral)  Resp 12  Wt 194 lb (87.998 kg)  SpO2 97% Body mass index is 28.25 kg/(m^2).   Wt Readings from Last 3 Encounters:  11/15/15 194 lb (87.998 kg)  08/09/15 197 lb (89.359 kg)  03/28/15 193 lb (87.544 kg)   Constitutional: overweight, in NAD Eyes: PERRLA, EOMI, no exophthalmos ENT: moist mucous membranes, no thyromegaly, no cervical lymphadenopathy Cardiovascular: RRR, No MRG, no edema Respiratory: CTA B Gastrointestinal: abdomen soft, NT, ND,  BS+ Musculoskeletal: no deformities, strength intact in all 4 Skin: moist, warm, no rashes Neurological: no tremor with outstretched hands, DTR normal in all 4  ASSESSMENT: 1. DM2, insulin-dependent, uncontrolled, without complications - ? PAD (decreased pulses in LE)  PLAN:  1. Patient with long-standing, recently more controlled diabetes, on basal-bolus insulin regimen + metformin. His sugars are higher in am and then drop after his am NovoLog dose >> he does not check sugars at bedtime to see if the culprit for this is possible postdinner  hyperglycemia >> will advise to check CBG at bedtime 2x a week. Also advised to take the lunchtime Novolog if eating as he now can skip this dose if sugars <140. - I suggested to:  Patient Instructions  Please change the insulin regimen as follows: - Increase Lantus to 34 units at bedtime.  - Continue NovoLog:  12-15 units before breakfast  6 units before lunch  15 units before dinner  Please return in 3 months with your sugar log.   - continue checking sugars at different times of the day - check 2-3 times a day, but mostly before meals, rotating checks - given new sugar logs - UTD yearly eye exams - flu shot given at last visit - checked Hba1c today >> 6.7% (improved!) - Return to clinic in 3 mo with sugar log - will check Lipids then.   Contact:  Lafonda Mosses (daughter) - in [facility]: [phone]

## 2015-11-15 NOTE — Patient Instructions (Signed)
Please change the insulin regimen as follows: - Increase Lantus to 34 units at bedtime.  - Continue NovoLog:  12-15 units before breakfast  6 units before lunch  15 units before dinner  Please return in 3 months with your sugar log.

## 2015-12-19 ENCOUNTER — Other Ambulatory Visit: Payer: Self-pay | Admitting: Internal Medicine

## 2016-01-01 ENCOUNTER — Other Ambulatory Visit: Payer: Self-pay | Admitting: Internal Medicine

## 2016-01-01 NOTE — Telephone Encounter (Signed)
Patrick Hernandez, it will not let me close this encounter.  Will you close for me? Thanks you!

## 2016-01-10 ENCOUNTER — Other Ambulatory Visit: Payer: Self-pay | Admitting: Family Medicine

## 2016-01-29 ENCOUNTER — Other Ambulatory Visit: Payer: Self-pay | Admitting: *Deleted

## 2016-01-29 MED ORDER — INSULIN ASPART 100 UNIT/ML FLEXPEN: PEN_INJECTOR | SUBCUTANEOUS | Status: DC

## 2016-01-31 ENCOUNTER — Telehealth: Payer: Self-pay | Admitting: Internal Medicine

## 2016-01-31 MED ORDER — GLUCOSE BLOOD VI STRP: ORAL_STRIP | Status: DC

## 2016-01-31 MED ORDER — INSULIN ASPART 100 UNIT/ML FLEXPEN: PEN_INJECTOR | SUBCUTANEOUS | Status: DC

## 2016-01-31 MED ORDER — BAYER CONTOUR NEXT MONITOR W/DEVICE KIT: PACK | Status: AC

## 2016-01-31 MED ORDER — BAYER MICROLET LANCETS MISC: Status: AC

## 2016-01-31 NOTE — Telephone Encounter (Signed)
Pt switched insurance and they said that the Humalog Pen and his test strips are not covered by his new insurance, said the pharmacy faxed over a request but haven't heard anything back yet.

## 2016-01-31 NOTE — Telephone Encounter (Signed)
Pt's ins' preferred meter is [employer]. Sending a new meter, strips and lancets to pt's pharmacy. We switched from Humalog to Novolog a few days ago. Resending rx for Novolog to pharmacy.

## 2016-02-14 ENCOUNTER — Encounter: Payer: Self-pay | Admitting: Internal Medicine

## 2016-02-14 ENCOUNTER — Other Ambulatory Visit (INDEPENDENT_AMBULATORY_CARE_PROVIDER_SITE_OTHER): Payer: BLUE CROSS/BLUE SHIELD | Admitting: *Deleted

## 2016-02-14 ENCOUNTER — Ambulatory Visit (INDEPENDENT_AMBULATORY_CARE_PROVIDER_SITE_OTHER): Payer: BLUE CROSS/BLUE SHIELD | Admitting: Internal Medicine

## 2016-02-14 VITALS — BP 128/60 | HR 94 | Temp 98.2°F | Resp 14 | Wt 199.8 lb

## 2016-02-14 DIAGNOSIS — E1149 Type 2 diabetes mellitus with other diabetic neurological complication: Secondary | ICD-10-CM

## 2016-02-14 LAB — LIPID PANEL
CHOLESTEROL: 98 mg/dL (ref 0–200)
HDL: 29.4 mg/dL — AB (ref >39.00)
NonHDL: 69.02
TRIGLYCERIDES: 279 mg/dL — AB (ref 0.0–149.0)
Total CHOL/HDL Ratio: 3
VLDL: 55.8 mg/dL — AB (ref 0.0–40.0)

## 2016-02-14 LAB — POCT GLYCOSYLATED HEMOGLOBIN (HGB A1C): Hemoglobin A1C: 6.7

## 2016-02-14 LAB — LDL CHOLESTEROL, DIRECT: Direct LDL: 39 mg/dL

## 2016-02-14 MED ORDER — INSULIN GLARGINE 100 UNIT/ML SOLOSTAR PEN: PEN_INJECTOR | SUBCUTANEOUS | Status: DC

## 2016-02-14 MED ORDER — INSULIN ASPART 100 UNIT/ML FLEXPEN: PEN_INJECTOR | SUBCUTANEOUS | Status: DC

## 2016-02-14 MED ORDER — METFORMIN HCL 500 MG PO TABS: ORAL_TABLET | ORAL | Status: DC

## 2016-02-14 NOTE — Progress Notes (Signed)
Patient ID: Patrick Hernandez, male   DOB: Mar 06, 1956, 60 y.o.   MRN: [id number]  HPI: Patrick Hernandez is a 60 y.o.-year-old male, returning for f/u for DM2, dx 2000, insulin-dependent since 2013, uncontrolled, without complications and HTN. Last visit 3 mo ago.  DM2: Last hemoglobin A1c was: Lab Results  Component Value Date   HGBA1C 6.7 11/15/2015   HGBA1C 7.1 08/09/2015   HGBA1C 8.8* 02/07/2015  Improvement in diet and starting to take the insulin consistently >> improved HbA1c.  Pt is on a regimen of: - Metformin 500 mg in am and 1000 mg in pm - Lantus to 34 units at bedtime.  - NovoLog:  15 units before breakfast  6 units before lunch  15 units before dinner  Pt checks his sugars 2-3x a day and they are high in am: - am: 107-189 (higher if eats a late dinner) >> 145-189 >> 126, 140-200 >> 162-209 >> 82-202 >> 83, 100-175, 186 - 2-3h after b'fast: 80-180 >> 73-140 >> 95-128 >> 95-135, 181 >> 73-164 >> 88-179, 195 >> n/c  - before lunch: n/c >> 80-166 >> 73, 82-139 - 2h after lunch: n/c >> 135-165 >> n/c >> 116-172 >> n/c - before dinner: n/c >> 110-147, 206 (rice) >> 122-180 >> 83-167, 199 >> n/c >> 89-165 >> 104-145, 190 - 2h after dinner: n/c >> 54 (if takes 12 units NovoLog) - 170 >> 84-145 >> n/c - bedtime: n/c >> 101-150, 175 >> 116-140 - nighttime: n/c No lows. Lowest sugar was 54 >> 84 >> 95 >> 88 >> 80 >> 73; he has hypoglycemia awareness at 90.  Highest sugar was 206 >> 295 ?>> 202 >> 204 >> 199  Pt's meals are: - Breakfast: brown rice, beans, soup (tofu, vegetables) - Lunch: same - Dinner: same, salads - Snacks: none  - Has CKD, last BUN/creatinine:  Lab Results  Component Value Date   BUN 22 02/07/2015   CREATININE 1.44 02/07/2015  On Avapro. - last set of lipids: Lab Results  Component Value Date   CHOL 109 09/15/2014   HDL 29.10* 09/15/2014   LDLCALC 56 09/15/2014   LDLDIRECT 74.4 02/02/2013   TRIG 122.0 09/15/2014   CHOLHDL 4 09/15/2014  On  Lipitor. - last eye exam was in 01/2015. no DR. He had Laser Sx in the past. - + numbness and tingling in his feet - 1rst-3rd toes bilaterally. He is on ASA 81.  He also has a history of BPH, GERD, HTN.  I reviewed pt's medications, allergies, PMH, social hx, family hx, and changes were documented in the history of present illness. Otherwise, unchanged from my initial visit note.  ROS: Constitutional: no weight gain/loss, no fatigue, no subjective hyperthermia/hypothermia Eyes: no blurry vision, no xerophthalmia ENT: no sore throat, no nodules palpated in throat, no dysphagia/odynophagia, no hoarseness Cardiovascular: no CP/SOB/palpitations/leg swelling Respiratory: no cough/SOB Gastrointestinal: no N/V/D/C, no heartburn Musculoskeletal: no muscle/joint aches Skin: no rashes Neurological: no tremors/numbness/tingling/dizziness  PE: BP 128/60 mmHg  Pulse 94  Temp(Src) 98.2 F (36.8 C) (Oral)  Resp 14  Wt 199 lb 12.8 oz (90.629 kg)  SpO2 96% Body mass index is 29.09 kg/(m^2).   Wt Readings from Last 3 Encounters:  02/14/16 199 lb 12.8 oz (90.629 kg)  11/15/15 194 lb (87.998 kg)  08/09/15 197 lb (89.359 kg)   Constitutional: overweight, in NAD Eyes: PERRLA, EOMI, no exophthalmos ENT: moist mucous membranes, no thyromegaly, no cervical lymphadenopathy Cardiovascular: RRR, No MRG, no edema Respiratory: CTA B  Gastrointestinal: abdomen soft, NT, ND, BS+ Musculoskeletal: no deformities, strength intact in all 4 Skin: moist, warm, no rashes Neurological: no tremor with outstretched hands, DTR normal in all 4  ASSESSMENT: 1. DM2, insulin-dependent, uncontrolled, without complications - ? PAD (decreased pulses in LE)  PLAN:  1. Patient with long-standing, recently more controlled diabetes, on basal-bolus insulin regimen + metformin. His sugars at last visit were higher in am and then dropped after his am NovoLog dose and he did not check sugars at bedtime to see if the culprit  for this is possible postdinner hyperglycemia >> advised to check CBG at bedtime 2x a week. Also advised to take the lunchtime Novolog if eating as he now can skip this dose if sugars <140. We also increased Lantus dose a little.  - Sugars at last visit are better but still high in am with sugars at goal at bedtime >> will move all Metformin at dinnertime. Also, since sugars before lunch maybe in the 70s and 80s, I advised him to use a lower dose of NovoLog with breakfast, if he plans to have a smaller meal. - I suggested to:  Patient Instructions  Please continue: - Lantus 34 units at bedtime.  - NovoLog:  12-15 units before breakfast  6 units before lunch  15 units before dinner  Please move all metformin (1500 mg) with dinner.  Please return in 3 months with your sugar log.   - continue checking sugars at different times of the day - check 2-3 times a day, but mostly before meals, rotating checks - given new sugar logs - UTD yearly eye exams - flu shot given this season - checked Hba1c today >> 6.7% (stable, great!) - will check Lipids and CMP today - advised to call and schedule appt with  (Dr Shawna Orleans or new PCP if he is not seeing pts anymore) - Return to clinic in 3 mo with sugar log  Contact:  Beverlee Nims (daughter) - in [facility]: [phone]  Orders Only on 02/14/2016  Component Date Value Ref Range Status  . Hemoglobin A1C 02/14/2016 6.7   Final  Office Visit on 02/14/2016  Component Date Value Ref Range Status  . Cholesterol 02/14/2016 98  0 - 200 mg/dL Final   ATP III Classification       Desirable:  < 200 mg/dL               Borderline High:  200 - 239 mg/dL          High:  > = 240 mg/dL  . Triglycerides 02/14/2016 279.0* 0.0 - 149.0 mg/dL Final   Normal:  <150 mg/dLBorderline High:  150 - 199 mg/dL  . HDL 02/14/2016 29.40* >39.00 mg/dL Final  . VLDL 02/14/2016 55.8* 0.0 - 40.0 mg/dL Final  . Total CHOL/HDL Ratio 02/14/2016 3   Final                  Men          Women1/2  Average Risk     3.4          3.3Average Risk          5.0          4.42X Average Risk          9.6          7.13X Average Risk          15.0          11.0                      .  NonHDL 02/14/2016 69.02   Final   NOTE:  Non-HDL goal should be 30 mg/dL higher than patient's LDL goal (i.e. LDL goal of < 70 mg/dL, would have non-HDL goal of < 100 mg/dL)  . Sodium 02/14/2016 140  135 - 146 mmol/L Final  . Potassium 02/14/2016 4.3  3.5 - 5.3 mmol/L Final  . Chloride 02/14/2016 106  98 - 110 mmol/L Final  . CO2 02/14/2016 20  20 - 31 mmol/L Final  . Glucose, Bld 02/14/2016 136* 65 - 99 mg/dL Final  . BUN 02/14/2016 35* 7 - 25 mg/dL Final  . Creat 02/14/2016 1.46* 0.70 - 1.33 mg/dL Final  . Total Bilirubin 02/14/2016 0.5  0.2 - 1.2 mg/dL Final  . Alkaline Phosphatase 02/14/2016 75  40 - 115 U/L Final  . AST 02/14/2016 18  10 - 35 U/L Final  . ALT 02/14/2016 15  9 - 46 U/L Final  . Total Protein 02/14/2016 6.4  6.1 - 8.1 g/dL Final  . Albumin 02/14/2016 4.2  3.6 - 5.1 g/dL Final  . Calcium 02/14/2016 8.7  8.6 - 10.3 mg/dL Final  . GFR, Est African American 02/14/2016 60  >=60 mL/min Final  . GFR, Est Non African American 02/14/2016 52* >=60 mL/min Final   Comment:   The estimated GFR is a calculation valid for adults (>=62 years old) that uses the CKD-EPI algorithm to adjust for age and sex. It is   not to be used for children, pregnant women, hospitalized patients,    patients on dialysis, or with rapidly changing kidney function. According to the NKDEP, eGFR >89 is normal, 60-89 shows mild impairment, 30-59 shows moderate impairment, 15-29 shows severe impairment and <15 is ESRD.     Marland Kitchen Direct LDL 02/14/2016 39.0   Final   Optimal:  <100 mg/dLNear or Above Optimal:  100-129 mg/dLBorderline High:  130-159 mg/dLHigh:  160-189 mg/dLVery High:  >190 mg/dL    Glucose and cholesterol levels better. Kidney fxn a little low, but not much changed from before. Advised him to stay hydrated.

## 2016-02-14 NOTE — Patient Instructions (Addendum)
Please continue: - Lantus 34 units at bedtime.  - NovoLog:  12-15 units before breakfast  6 units before lunch  15 units before dinner  Please move all metformin (1500 mg) with dinner.  Please return in 3 months with your sugar log.

## 2016-02-15 LAB — COMPLETE METABOLIC PANEL WITH GFR
ALBUMIN: 4.2 g/dL (ref 3.6–5.1)
ALK PHOS: 75 U/L (ref 40–115)
ALT: 15 U/L (ref 9–46)
AST: 18 U/L (ref 10–35)
BUN: 35 mg/dL — AB (ref 7–25)
CALCIUM: 8.7 mg/dL (ref 8.6–10.3)
CHLORIDE: 106 mmol/L (ref 98–110)
CO2: 20 mmol/L (ref 20–31)
Creat: 1.46 mg/dL — ABNORMAL HIGH (ref 0.70–1.33)
GFR, EST AFRICAN AMERICAN: 60 mL/min (ref >=60)
GFR, Est Non African American: 52 mL/min — ABNORMAL LOW (ref >=60)
Glucose, Bld: 136 mg/dL — ABNORMAL HIGH (ref 65–99)
POTASSIUM: 4.3 mmol/L (ref 3.5–5.3)
Sodium: 140 mmol/L (ref 135–146)
Total Bilirubin: 0.5 mg/dL (ref 0.2–1.2)
Total Protein: 6.4 g/dL (ref 6.1–8.1)

## 2016-02-19 ENCOUNTER — Telehealth: Payer: Self-pay | Admitting: Internal Medicine

## 2016-02-19 ENCOUNTER — Encounter: Payer: Self-pay | Admitting: *Deleted

## 2016-02-19 NOTE — Telephone Encounter (Signed)
Returned pt's daughter's call. Advised her that I have tried numerous times to call him to give him his lab results and I was unable to lvm. She understood. She asked that we call her in the future. Advised her of his lab results. She will advise pt. She requested lab results be mailed to her father. Results mailed today.

## 2016-02-19 NOTE — Telephone Encounter (Signed)
PT daughter called and said that her father has been trying to get his lab results, she requests a call back to her and you may leave a message.

## 2016-03-08 ENCOUNTER — Other Ambulatory Visit: Payer: Self-pay | Admitting: Internal Medicine

## 2016-04-18 ENCOUNTER — Other Ambulatory Visit: Payer: Self-pay | Admitting: Family Medicine

## 2016-05-23 ENCOUNTER — Ambulatory Visit (INDEPENDENT_AMBULATORY_CARE_PROVIDER_SITE_OTHER): Payer: BLUE CROSS/BLUE SHIELD | Admitting: Internal Medicine

## 2016-05-23 ENCOUNTER — Encounter: Payer: Self-pay | Admitting: Internal Medicine

## 2016-05-23 VITALS — BP 130/68 | HR 83 | Temp 98.1°F | Wt 196.2 lb

## 2016-05-23 DIAGNOSIS — E1149 Type 2 diabetes mellitus with other diabetic neurological complication: Secondary | ICD-10-CM | POA: Diagnosis not present

## 2016-05-23 LAB — POCT GLYCOSYLATED HEMOGLOBIN (HGB A1C): Hemoglobin A1C: 7.4

## 2016-05-23 MED ORDER — METFORMIN HCL 500 MG PO TABS: ORAL_TABLET | ORAL | Status: DC

## 2016-05-23 MED ORDER — INSULIN ASPART 100 UNIT/ML FLEXPEN: PEN_INJECTOR | SUBCUTANEOUS | Status: DC

## 2016-05-23 MED ORDER — INSULIN GLARGINE 100 UNIT/ML SOLOSTAR PEN: PEN_INJECTOR | SUBCUTANEOUS | Status: DC

## 2016-05-23 NOTE — Progress Notes (Signed)
Pre visit review using our clinic review tool, if applicable. No additional management support is needed unless otherwise documented below in the visit note. 

## 2016-05-23 NOTE — Patient Instructions (Addendum)
Please continue: - Metformin 1500 mg with dinner. - NovoLog:  15 units before breakfast  6 units before lunch  15 units before dinner  Please increase Lantus to 38 units at bedtime. If sugars still high in am, increase to 40 units.  Please schedule a new eye exam.  Please return in 3 months with your sugar log.

## 2016-05-23 NOTE — Progress Notes (Signed)
Patient ID: Patrick Hernandez, male   DOB: 03-Mar-1956, 60 y.o.   MRN: [id number]  HPI: Patrick Hernandez is a 60 y.o.-year-old male, returning for f/u for DM2, dx 2000, insulin-dependent since 2013, uncontrolled, without complications and HTN. Last visit 3 mo ago.  DM2: Last hemoglobin A1c was: Lab Results  Component Value Date   HGBA1C 6.7 02/14/2016   HGBA1C 6.7 11/15/2015   HGBA1C 7.1 08/09/2015  Improvement in diet and starting to take the insulin consistently >> improved HbA1c.  Pt is on a regimen of: - Metformin 1500 mg with dinner. - Lantus 34 units at bedtime.  - NovoLog:  15 units before breakfast  6 units before lunch  15 units before dinner  Pt checks his sugars 2-3x a day and they are high in am: - am: 145-189 >> 126, 140-200 >> 162-209 >> 82-202 >> 83, 100-175, 186 >> 134-171, 200 - 2-3h after b'fast: 80-180 >> 73-140 >> 95-128 >> 95-135, 181 >> 73-164 >> 88-179, 195 >> n/c  - before lunch: n/c >> 80-166 >> 73, 82-139 >> 88-130, 145 - 2h after lunch: n/c >> 135-165 >> n/c >> 116-172 >> n/c - before dinner: n/c >> 110-147, 206 (rice) >> 122-180 >> 83-167, 199 >> n/c >> 89-165 >> 104-145, 190 >> 97-135, 145 - 2h after dinner: n/c >> 54 (if takes 12 units NovoLog) - 170 >> 84-145 >> n/c - bedtime: n/c >> 101-150, 175 >> 116-140 >> 110-160 - nighttime: n/c No lows. Lowest sugar was 54 >> 84 >> 95 >> 88 >> 80 >> 73 >> 88; he has hypoglycemia awareness at 90.  Highest sugar was 206 >> 295 ?>> 202 >> 204 >> 199 >> 200  Pt's meals are: - Breakfast: brown rice, beans, soup (tofu, vegetables) - Lunch: same - Dinner: same, salads - Snacks: none  - Has CKD, last BUN/creatinine:  Lab Results  Component Value Date   BUN 35* 02/14/2016   CREATININE 1.46* 02/14/2016  On Avapro. - last set of lipids: Lab Results  Component Value Date   CHOL 98 02/14/2016   HDL 29.40* 02/14/2016   LDLCALC 56 09/15/2014   LDLDIRECT 39.0 02/14/2016   TRIG 279.0* 02/14/2016   CHOLHDL 3 02/14/2016   On Lipitor. - last eye exam was in 01/2015. no DR. He had Laser Sx in the past. - + numbness and tingling in his feet - 1rst-3rd toes bilaterally. He is on ASA 81.  He also has a history of BPH, GERD, HTN.  I reviewed pt's medications, allergies, PMH, social hx, family hx, and changes were documented in the history of present illness. Otherwise, unchanged from my initial visit note.  ROS: Constitutional: no weight gain/loss, no fatigue, no subjective hyperthermia/hypothermia Eyes: no blurry vision, no xerophthalmia ENT: no sore throat, no nodules palpated in throat, no dysphagia/odynophagia, no hoarseness Cardiovascular: no CP/SOB/palpitations/leg swelling Respiratory: no cough/SOB Gastrointestinal: no N/V/D/C, no heartburn Musculoskeletal: no muscle/joint aches Skin: no rashes Neurological: no tremors/numbness/tingling/dizziness  PE: BP 130/68 mmHg  Pulse 83  Temp(Src) 98.1 F (36.7 C) (Oral)  Wt 196 lb 3.2 oz (88.996 kg)  SpO2 97% Body mass index is 28.57 kg/(m^2).   Wt Readings from Last 3 Encounters:  05/23/16 196 lb 3.2 oz (88.996 kg)  02/14/16 199 lb 12.8 oz (90.629 kg)  11/15/15 194 lb (87.998 kg)   Constitutional: overweight, in NAD Eyes: PERRLA, EOMI, no exophthalmos ENT: moist mucous membranes, no thyromegaly, no cervical lymphadenopathy Cardiovascular: RRR, No MRG, no edema Respiratory: CTA B  Gastrointestinal: abdomen soft, NT, ND, BS+ Musculoskeletal: no deformities, strength intact in all 4 Skin: moist, warm, no rashes Neurological: no tremor with outstretched hands, DTR normal in all 4  ASSESSMENT: 1. DM2, insulin-dependent, uncontrolled, without complications - ? PAD (decreased pulses in LE)  PLAN:  1. Patient with long-standing, recently more controlled diabetes, on basal-bolus insulin regimen + metformin. His sugars are higher in am, especially during his vacation but also before this >> will increase Lantus dose a little.  - I suggested to:   Patient Instructions  Please continue: - Metformin 1500 mg with dinner. - NovoLog:  15 units before breakfast  6 units before lunch  15 units before dinner  Please increase Lantus to 38 units at bedtime. If sugars still high in am, increase to 40 units.  Please schedule a new eye exam.  Please return in 3 months with your sugar log.   - continue checking sugars at different times of the day - check 2-3 times a day, but mostly before meals, rotating checks - given new sugar logs - needs a new yearly eye exam >> advised to schedule - checked Hba1c today >> 7.4% (higher) - Return to clinic in 3 mo with sugar log  Contact:  Lafonda MossesDiana (daughter) - [facility]: [phone]

## 2016-05-23 NOTE — Addendum Note (Signed)
Addended by: Verdie ShireBAYNES, Charnee Turnipseed M on: 05/23/2016 04:17 PM   Modules accepted: Orders

## 2016-07-15 ENCOUNTER — Encounter: Payer: Self-pay | Admitting: Family Medicine

## 2016-07-15 ENCOUNTER — Ambulatory Visit (INDEPENDENT_AMBULATORY_CARE_PROVIDER_SITE_OTHER): Payer: BLUE CROSS/BLUE SHIELD | Admitting: Family Medicine

## 2016-07-15 VITALS — BP 140/70 | HR 80 | Temp 98.1°F | Ht 69.5 in | Wt 195.8 lb

## 2016-07-15 DIAGNOSIS — I10 Essential (primary) hypertension: Secondary | ICD-10-CM

## 2016-07-15 DIAGNOSIS — Z23 Encounter for immunization: Secondary | ICD-10-CM

## 2016-07-15 DIAGNOSIS — E1149 Type 2 diabetes mellitus with other diabetic neurological complication: Secondary | ICD-10-CM

## 2016-07-15 DIAGNOSIS — R609 Edema, unspecified: Secondary | ICD-10-CM

## 2016-07-15 DIAGNOSIS — K219 Gastro-esophageal reflux disease without esophagitis: Secondary | ICD-10-CM | POA: Diagnosis not present

## 2016-07-15 MED ORDER — ATORVASTATIN CALCIUM 40 MG PO TABS: ORAL_TABLET | ORAL | 1 refills | Status: DC

## 2016-07-15 MED ORDER — IRBESARTAN 150 MG PO TABS: ORAL_TABLET | ORAL | 0 refills | Status: DC

## 2016-07-15 MED ORDER — HYDROCHLOROTHIAZIDE 12.5 MG PO TABS: 12.5000 mg | ORAL_TABLET | Freq: Every day | ORAL | 0 refills | Status: DC

## 2016-07-15 MED ORDER — PANTOPRAZOLE SODIUM 20 MG PO TBEC: 20.0000 mg | DELAYED_RELEASE_TABLET | Freq: Every day | ORAL | 0 refills | Status: DC

## 2016-07-15 NOTE — Progress Notes (Signed)
Pre visit review using our clinic review tool, if applicable. No additional management support is needed unless otherwise documented below in the visit note. 

## 2016-07-15 NOTE — Patient Instructions (Addendum)
BEFORE YOU LEAVE: -follow up: set up a new patient visit/physical with Dr. Selena BattenKim or another provider pending pt preference - will need [place] interpreter for all visits -labs -flu shot  Restart the following medications:  For blood pressure: -hydrochlorthiazide 12.5 mg daily -irbesartan 150mg  daily  For cholesterol: -atorvastatin (lipitor 40mg  daily)  For Acid: -protonix 20mg  daily  We have ordered labs or studies at this visit. It can take up to 1-2 weeks for results and processing. IF results require follow up or explanation, we will call you with instructions. Clinically stable results will be released to your [facility]. If you have not heard from us or cannot find your results in [facility] in 2 weeks please contact our office at [phone].  If you are not yet signed up for [facility], please consider signing up.   We recommend the following healthy lifestyle for LIFE: 1) Small portions.   Tip: eat off of a salad plate instead of a dinner plate.  Tip: It is ok to feel hungry after a meal - that likely means you ate an appropriate portion.  Tip: if you need more or a snack choose fruits, veggies and/or a handful of nuts or seeds.  2) Eat a healthy clean diet.  * Tip: Avoid (less then 1 serving per week): processed foods, sweets, sweetened drinks, white starches (rice, flour, bread, potatoes, pasta, etc), red meat, fast foods, butter  *Tip: CHOOSE instead   * 5-9 servings per day of fresh or frozen fruits and vegetables (but not corn, potatoes, bananas, canned or dried fruit)   *nuts and seeds, beans   *olives and olive oil   *small portions of lean meats such as fish and white chicken    *small portions of whole grains  3)Get at least 150 minutes of sweaty aerobic exercise per week.  4)Reduce stress - consider counseling, meditation and relaxation to balance other aspects of your life.

## 2016-07-15 NOTE — Progress Notes (Signed)
HPI:  Patrick Hernandez is here for follow up and medication refills.  Prior pt of Dr. Shawna Orleans. He does not know whom he wants to see as his new provider. He has poor [place] skills - speaks [place].  Has the following chronic problems that require follow up and concerns today:  DM: -chronic -managed bt endo, Dr. Cruzita Lederer -per note uncontrolled insulin dependent -he reports changing diet and recent improvement in sugars and BP -complications: none per endo notes but PMH states with neuropathy -meds: per endo instructions lantus 38-->40 qhs, metformin 1500 with dinner, mealtime insulin (novolog) 15/6/15 -Hgba1c 7.4 05/2016 -denies:hypoglycemia, vision changes  HTN: -chornic -meds: asa, amlodipine 10, hctz 12.5, irbesartan 150, toprol xl 100 bid on list - but he reports has been out of all for over 1 months -denies: CP, SOB, DOE -chronically has some mild LE edema   HLD: -at goal 4.2017 -meds: lipitor 40 - out of cholesterol medication  GERD: -meds: protonix 40 - out of this medications and having heartburn a few days per week  BPH: -meds: flomax -out of medication, no symptoms  Hx third cranial nerve palsy in 2016: -neg eval with MRI and dopplers per prior PCP notes -managed by optho, seeing Dr. Posey Pronto  ROS negative for unless reported above: fevers, unintentional weight loss, hearing or vision loss, chest pain, palpitations, struggling to breath, hemoptysis, melena, hematochezia, hematuria, falls, loc, si, thoughts of self harm  Past Medical History:  Diagnosis Date  . 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 Hypertension   . Palpitations   . Polyneuropathy in diabetes(357.2)   . Type II or unspecified type diabetes mellitus with neurological manifestations, not stated as uncontrolled     Past Surgical History:  Procedure Laterality Date  . None      Family History  Problem Relation Age of Onset  . Diabetes Mother   . Diabetes Father   . Heart disease Father     Pacemaker     Social History   Social History  . Marital status: Married    Spouse name: N/A  . Number of children: N/A  . Years of education: N/A   Occupational History  . Self employed    Social History Main Topics  . Smoking status: Never Smoker  . Smokeless tobacco: None  . Alcohol use No  . Drug use: No  . Sexual activity: Not Asked   Other Topics Concern  . None   Social History Narrative   Married   Self employed - dry cleaning business     Current Outpatient Prescriptions:  .  aspirin 81 MG tablet, Take 81 mg by mouth daily., Disp: , Rfl:  .  atorvastatin (LIPITOR) 40 MG tablet, TAKE 1 TABLET (40 MG TOTAL) BY MOUTH DAILY., Disp: 90 tablet, Rfl: 1 .  BAYER MICROLET LANCETS lancets, Use to test blood sugar 3 times daily as instructed. Dx: E11.40, Disp: 100 each, Rfl: 11 .  BD PEN NEEDLE NANO U/F 32G X 4 MM MISC, 1 STICK 4 (FOUR) TIMES DAILY., Disp: 100 each, Rfl: 9 .  Blood Glucose Monitoring Suppl (BAYER CONTOUR NEXT MONITOR) w/Device KIT, Use to test blood sugar daily as instructed. Dx: E11.40, Disp: 1 kit, Rfl: 0 .  glucose blood (BAYER CONTOUR NEXT TEST) test strip, Use to test blood sugar 3 times daily as instructed. Dx: E11.40, Disp: 100 each, Rfl: 11 .  hydrochlorothiazide (HYDRODIURIL) 12.5 MG tablet, Take 1 tablet (12.5 mg total) by mouth daily., Disp: 90 tablet,  Rfl: 0 .  insulin aspart (NOVOLOG FLEXPEN) 100 UNIT/ML FlexPen, Inject up to 36 units daily as instructed., Disp: 30 mL, Rfl: 1 .  Insulin Glargine (LANTUS SOLOSTAR) 100 UNIT/ML Solostar Pen, INJECT 0.38 MLS (38 UNITS TOTAL) INTO THE SKIN AT BEDTIME., Disp: 30 mL, Rfl: 1 .  irbesartan (AVAPRO) 150 MG tablet, TAKE 1 TABLET (150 MG TOTAL) BY MOUTH DAILY., Disp: 90 tablet, Rfl: 0 .  metFORMIN (GLUCOPHAGE) 500 MG tablet, Take 3 tablets in pm with dinner, Disp: 270 tablet, Rfl: 1 .  pantoprazole (PROTONIX) 20 MG tablet, Take 1 tablet (20 mg total) by mouth daily., Disp: 90 tablet, Rfl: 0  EXAM:  Vitals:    07/15/16 1502  BP: 140/70  Pulse: 80  Temp: 98.1 F (36.7 C)    Body mass index is 28.5 kg/m.  GENERAL: vitals reviewed and listed above, alert, oriented, appears well hydrated and in no acute distress  HEENT: atraumatic, conjunttiva clear, no obvious abnormalities on inspection of external nose and ears  NECK: no obvious masses on inspection  LUNGS: clear to auscultation bilaterally, no wheezes, rales or rhonchi, good air movement  CV: HRRR, no peripheral edema  MS: moves all extremities without noticeable abnormality  PSYCH: pleasant and cooperative, no obvious depression or anxiety  ASSESSMENT AND PLAN:  Discussed the following assessment and plan:  Diabetes mellitus type 2 with neurological manifestations ([place]) -managed by endocrinology  Essential hypertension -will restart diuretic given complaint of LE edema, arb given diabetes -hold on BB and norvasc today given BP seems to have improved and has been out of these for some time - may need to restart BB for palpitations if frequent or would be a good option if BP remains uncontrolled  Peripheral edema -restart diuretic, hold norvasc given out and may not need for BP  Gastroesophageal reflux disease, esophagitis presence not specified -restart PPI - may not need high dose, will start with protonix '20mg'$  daily  -lifestyle recs for healthy diet and regular exercise advised -Patient advised to return or notify a doctor immediately if symptoms worsen or persist or new concerns arise.  Patient Instructions  BEFORE YOU LEAVE: -follow up: set up a new patient visit/physical with Dr. Maudie Mercury or another provider pending pt preference - will need [place] interpreter for all visits -labs -flu shot  Restart the following medications:  For blood pressure: -hydrochlorthiazide 12.5 mg daily -irbesartan '150mg'$  daily  For cholesterol: -atorvastatin (lipitor '40mg'$  daily)  For Acid: -protonix '20mg'$  daily  We have ordered labs or  studies at this visit. It can take up to 1-2 weeks for results and processing. IF results require follow up or explanation, we will call you with instructions. Clinically stable results will be released to your [facility]. If you have not heard from [place] or cannot find your results in [facility] in 2 weeks please contact our office at [phone].  If you are not yet signed up for [facility], please consider signing up.   We recommend the following healthy lifestyle for LIFE: 1) Small portions.   Tip: eat off of a salad plate instead of a dinner plate.  Tip: It is ok to feel hungry after a meal - that likely means you ate an appropriate portion.  Tip: if you need more or a snack choose fruits, veggies and/or a handful of nuts or seeds.  2) Eat a healthy clean diet.  * Tip: Avoid (less then 1 serving per week): processed foods, sweets, sweetened drinks, white starches (rice, flour, bread, potatoes,  pasta, etc), red meat, fast foods, butter  *Tip: CHOOSE instead   * 5-9 servings per day of fresh or frozen fruits and vegetables (but not corn, potatoes, bananas, canned or dried fruit)   *nuts and seeds, beans   *olives and olive oil   *small portions of lean meats such as fish and white chicken    *small portions of whole grains  3)Get at least 150 minutes of sweaty aerobic exercise per week.  4)Reduce stress - consider counseling, meditation and relaxation to balance other aspects of your life.        Colin Benton R.

## 2016-07-16 LAB — CBC
HCT: 39.3 % (ref 39.0–52.0)
Hemoglobin: 13.6 g/dL (ref 13.0–17.0)
MCHC: 34.7 g/dL (ref 30.0–36.0)
MCV: 88.2 fl (ref 78.0–100.0)
PLATELETS: 270 10*3/uL (ref 150.0–400.0)
RBC: 4.46 Mil/uL (ref 4.22–5.81)
RDW: 13.1 % (ref 11.5–15.5)
WBC: 8.7 10*3/uL (ref 4.0–10.5)

## 2016-07-16 LAB — BASIC METABOLIC PANEL
BUN: 32 mg/dL — ABNORMAL HIGH (ref 6–23)
CALCIUM: 8.7 mg/dL (ref 8.4–10.5)
CO2: 24 meq/L (ref 19–32)
CREATININE: 1.91 mg/dL — AB (ref 0.40–1.50)
Chloride: 106 mEq/L (ref 96–112)
GFR: 38.44 mL/min — AB (ref >60.00)
Glucose, Bld: 157 mg/dL — ABNORMAL HIGH (ref 70–99)
Potassium: 4.8 mEq/L (ref 3.5–5.1)
SODIUM: 137 meq/L (ref 135–145)

## 2016-07-18 ENCOUNTER — Telehealth: Payer: Self-pay | Admitting: *Deleted

## 2016-07-18 NOTE — Telephone Encounter (Signed)
Lease see pt handout instructions as we discussed. Thanks.

## 2016-07-18 NOTE — Telephone Encounter (Signed)
Spoke with daughter Bernita Buffy(Dianna) and let her know patient is on HCTZ and Irbesartan which are for blood pressure and that the HCTZ should help with leg swelling which apparently is better. Let her know that both of those meds were sent in to pharmacy on 9-5 for #90 so patient should have plenty. Told her to feel free to call us back with any more questions or concerns.

## 2016-07-18 NOTE — Telephone Encounter (Signed)
Left message on daughters cell phone (Dianna) to call us back.

## 2016-07-18 NOTE — Telephone Encounter (Signed)
Spoke with patients daughter (DPR) Dianna. Let her know about patients lab results and Dr. Elmyra RicksKim's instructions. Patient wanted to know if he should continue the HCTZ for swelling in his leg and should he go back on the BP medicine?

## 2016-07-22 ENCOUNTER — Other Ambulatory Visit: Payer: Self-pay | Admitting: Internal Medicine

## 2016-08-22 ENCOUNTER — Encounter: Payer: Self-pay | Admitting: Internal Medicine

## 2016-08-22 ENCOUNTER — Ambulatory Visit (INDEPENDENT_AMBULATORY_CARE_PROVIDER_SITE_OTHER): Payer: BLUE CROSS/BLUE SHIELD | Admitting: Internal Medicine

## 2016-08-22 VITALS — BP 142/71 | HR 83 | Ht 69.5 in | Wt 199.0 lb

## 2016-08-22 DIAGNOSIS — E1149 Type 2 diabetes mellitus with other diabetic neurological complication: Secondary | ICD-10-CM | POA: Diagnosis not present

## 2016-08-22 LAB — POCT GLYCOSYLATED HEMOGLOBIN (HGB A1C): HEMOGLOBIN A1C: 7.2

## 2016-08-22 NOTE — Patient Instructions (Addendum)
Please continue:  - Lantus 38 units at bedtime - Metformin 1500 mg with dinner. - NovoLog:  15 units before breakfast  6 units before lunch  15 units before dinner  Please return in 3 months with your sugar log.

## 2016-08-22 NOTE — Addendum Note (Signed)
Addended by: Bobbye RiggsWALKER, Canaan Holzer L on: 08/22/2016 05:02 PM   Modules accepted: Orders

## 2016-08-22 NOTE — Progress Notes (Signed)
Patient ID: Patrick Hernandez, male   DOB: April 24, 1956, 60 y.o.   MRN: [id number]  HPI: Patrick Hernandez is a 60 y.o.-year-old male, returning for f/u for DM2, dx 2000, insulin-dependent since 2013, uncontrolled, without complications and HTN. Last visit 3 mo ago.  DM2: Last hemoglobin A1c was: Lab Results  Component Value Date   HGBA1C 7.4 05/23/2016   HGBA1C 6.7 02/14/2016   HGBA1C 6.7 11/15/2015  Improvement in diet and starting to take the insulin consistently >> improved HbA1c.  Pt is on a regimen of: - Metformin 1500 mg with dinner. - Lantus 34 >> 38 units at bedtime.  - NovoLog:  15 units before breakfast  6 units before lunch  15 units before dinner  Pt checks his sugars 2-3x a day and they are still high in am as he is eating dinner late (comes home at 7 pm and may eat dinner as late as 11 pm): - am: 162-209 >> 82-202 >> 83, 100-175, 186 >> 134-171, 200 >> 98, 108-165, 180 (higher when dinner is late) - 2-3h after b'fast: 80-180 >> 73-140 >> 95-128 >> 95-135, 181 >> 73-164 >> 88-179, 195 >> n/c  - before lunch: n/c >> 80-166 >> 73, 82-139 >> 88-130, 145 >> 80-147, 160 - 2h after lunch: n/c >> 135-165 >> n/c >> 116-172 >> n/c - before dinner: 122-180 >> 83-167, 199 >> n/c >> 89-165 >> 104-145, 190 >> 97-135, 145 >> 83-126, 147 - 2h after dinner: n/c >> 54 (if takes 12 units NovoLog) - 170 >> 84-145 >> n/c - bedtime: n/c >> 101-150, 175 >> 116-140 >> 110-160 >> 87 - nighttime: n/c No lows. Lowest sugar was 88 >> 83; he has hypoglycemia awareness at 90.  Highest sugar was 200 >> 180  Pt's meals are: - Breakfast: brown rice, beans, soup (tofu, vegetables) - Lunch: same - Dinner: same, salads - Snacks: none  - Has CKD, last BUN/creatinine:  Lab Results  Component Value Date   BUN 32 (H) 07/15/2016   CREATININE 1.91 (H) 07/15/2016  On Avapro.  Creatinine increased at last check: Lab Results  Component Value Date   CREATININE 1.91 (H) 07/15/2016   CREATININE 1.46 (H)  02/14/2016   CREATININE 1.44 02/07/2015   CREATININE 1.4 (A) 12/15/2014   CREATININE 1.3 02/27/2014   - last set of lipids: Lab Results  Component Value Date   CHOL 98 02/14/2016   HDL 29.40 (L) 02/14/2016   LDLCALC 56 09/15/2014   LDLDIRECT 39.0 02/14/2016   TRIG 279.0 (H) 02/14/2016   CHOLHDL 3 02/14/2016  On Lipitor. - last eye exam was in 01/2015. no DR. He had Laser Sx in the past. Will have this next week. - + numbness and tingling in his feet - 1rst-3rd toes bilaterally. He is on ASA 81.  He also has a history of BPH, GERD, HTN.  I reviewed pt's medications, allergies, PMH, social hx, family hx, and changes were documented in the history of present illness. Otherwise, unchanged from my initial visit note.  ROS: Constitutional: no weight gain/loss, no fatigue, no subjective hyperthermia/hypothermia Eyes: no blurry vision, no xerophthalmia ENT: no sore throat, no nodules palpated in throat, no dysphagia/odynophagia, no hoarseness Cardiovascular: no CP/SOB/palpitations/leg swelling Respiratory: no cough/SOB Gastrointestinal: no N/V/D/C, no heartburn Musculoskeletal: no muscle/joint aches Skin: no rashes Neurological: no tremors/numbness/tingling/dizziness  PE: BP (!) 142/71   Pulse 83   Ht 5' 9.5" (1.765 m)   Wt 199 lb (90.3 kg)   BMI 28.97 kg/m  Body mass index is 28.97 kg/m.   Wt Readings from Last 3 Encounters:  08/22/16 199 lb (90.3 kg)  07/15/16 195 lb 12.8 oz (88.8 kg)  05/23/16 196 lb 3.2 oz (89 kg)   Constitutional: overweight, in NAD Eyes: PERRLA, EOMI, no exophthalmos ENT: moist mucous membranes, no thyromegaly, no cervical lymphadenopathy Cardiovascular: RRR, No MRG, no edema Respiratory: CTA B Gastrointestinal: abdomen soft, NT, ND, BS+ Musculoskeletal: no deformities, strength intact in all 4 Skin: moist, warm, no rashes Neurological: no tremor with outstretched hands, DTR normal in all 4  ASSESSMENT: 1. DM2, insulin-dependent, uncontrolled,  without complications - ? PAD (decreased pulses in LE)  PLAN:  1. Patient with long-standing, recently more controlled diabetes, on basal-bolus insulin regimen + metformin. His sugars are still higher in am, as he eats dinner late >> advised him to try to move dinner earlier, but no change in regimen needed. - I suggested to:  Patient Instructions  Please continue:  - Lantus 38 units at bedtime - Metformin 1500 mg with dinner. - NovoLog:  15 units before breakfast  6 units before lunch  15 units before dinner  Please return in 3 months with your sugar log.   - continue checking sugars at different times of the day - check 2-3 times a day, but mostly before meals, rotating checks - given new sugar logs - needs a new yearly eye exam >>scheduled - checked Hba1c today >> 7.2% (better) - Return to clinic in 3 mo with sugar log  Contact:  Lafonda Mosses (daughter) - in [facility]: [phone]  Carlus Pavlov, MD PhD [facility] Endocrinology

## 2016-09-16 LAB — HM DIABETES EYE EXAM

## 2016-09-26 ENCOUNTER — Encounter: Payer: Self-pay | Admitting: Family Medicine

## 2016-09-26 ENCOUNTER — Ambulatory Visit (INDEPENDENT_AMBULATORY_CARE_PROVIDER_SITE_OTHER): Payer: BLUE CROSS/BLUE SHIELD | Admitting: Family Medicine

## 2016-09-26 ENCOUNTER — Telehealth: Payer: Self-pay | Admitting: Internal Medicine

## 2016-09-26 VITALS — BP 130/60 | HR 89 | Temp 98.3°F | Ht 68.0 in | Wt 198.9 lb

## 2016-09-26 DIAGNOSIS — E1149 Type 2 diabetes mellitus with other diabetic neurological complication: Secondary | ICD-10-CM

## 2016-09-26 DIAGNOSIS — Z0000 Encounter for general adult medical examination without abnormal findings: Secondary | ICD-10-CM

## 2016-09-26 DIAGNOSIS — I10 Essential (primary) hypertension: Secondary | ICD-10-CM | POA: Diagnosis not present

## 2016-09-26 DIAGNOSIS — R609 Edema, unspecified: Secondary | ICD-10-CM

## 2016-09-26 DIAGNOSIS — Z23 Encounter for immunization: Secondary | ICD-10-CM | POA: Diagnosis not present

## 2016-09-26 DIAGNOSIS — N401 Enlarged prostate with lower urinary tract symptoms: Secondary | ICD-10-CM

## 2016-09-26 NOTE — Progress Notes (Signed)
HPI:  Here for CPE:  -Concerns and/or follow up today: none Numerous chronic medical problems with history poor compliance. Sees endocrinologist and opthomologist for his diabetes. Also has HTN, HLD CKD, circulatory issues, PBH on flomax with prior PCP on statin, arb, diuretic and asa.   -Diet: variety of foods, balance and well rounded, larger portion sizes - has cut back on rice  -Exercise: no regular exercise  -Diabetes and Dyslipidemia Screening: done  -Hx of HTN: no  -Vaccines: agrees to tetanus booster  -sexual activity: yes, male partner, no new partners  -wants STI testing, Hep C screening (if born 74-1964): no  -FH colon or prstate ca: see FH Last colon cancer screening: not done - advised today and discussed options, he refuses  Last prostate ca screening: wants PSA - refuses other  -Alcohol, Tobacco, drug use: see social history  Review of Systems - no fevers, unintentional weight loss, vision loss, hearing loss, chest pain, sob, hemoptysis, melena, hematochezia, hematuria, genital discharge, changing or concerning skin lesions, bleeding, bruising, loc, thoughts of self harm or SI  Past Medical History:  Diagnosis Date  . 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 Hypertension   . Palpitations   . Polyneuropathy in diabetes(357.2)   . Type II or unspecified type diabetes mellitus with neurological manifestations, not stated as uncontrolled(250.60)     Past Surgical History:  Procedure Laterality Date  . None      Family History  Problem Relation Age of Onset  . Diabetes Mother   . Diabetes Father   . Heart disease Father     Pacemaker    Social History   Social History  . Marital status: Married    Spouse name: N/A  . Number of children: N/A  . Years of education: N/A   Occupational History  . Self employed    Social History Main Topics  . Smoking status: Never Smoker  . Smokeless tobacco: None  . Alcohol use No  . Drug use: No  . Sexual  activity: Not Asked   Other Topics Concern  . None   Social History Narrative   Married   Self employed - dry cleaning business     Current Outpatient Prescriptions:  .  aspirin 81 MG tablet, Take 81 mg by mouth daily., Disp: , Rfl:  .  atorvastatin (LIPITOR) 40 MG tablet, TAKE 1 TABLET (40 MG TOTAL) BY MOUTH DAILY., Disp: 90 tablet, Rfl: 1 .  BAYER MICROLET LANCETS lancets, Use to test blood sugar 3 times daily as instructed. Dx: E11.40, Disp: 100 each, Rfl: 11 .  BD PEN NEEDLE NANO U/F 32G X 4 MM MISC, 1 STICK 4 (FOUR) TIMES DAILY., Disp: 100 each, Rfl: 9 .  Blood Glucose Monitoring Suppl (BAYER CONTOUR NEXT MONITOR) w/Device KIT, Use to test blood sugar daily as instructed. Dx: E11.40, Disp: 1 kit, Rfl: 0 .  glucose blood (BAYER CONTOUR NEXT TEST) test strip, Use to test blood sugar 3 times daily as instructed. Dx: E11.40, Disp: 100 each, Rfl: 11 .  hydrochlorothiazide (HYDRODIURIL) 12.5 MG tablet, Take 1 tablet (12.5 mg total) by mouth daily., Disp: 90 tablet, Rfl: 0 .  insulin aspart (NOVOLOG FLEXPEN) 100 UNIT/ML FlexPen, Inject up to 36 units daily as instructed., Disp: 30 mL, Rfl: 1 .  Insulin Glargine (LANTUS SOLOSTAR) 100 UNIT/ML Solostar Pen, INJECT 0.38 MLS (38 UNITS TOTAL) INTO THE SKIN AT BEDTIME., Disp: 30 mL, Rfl: 1 .  irbesartan (AVAPRO) 150 MG tablet, TAKE 1 TABLET (150 MG  TOTAL) BY MOUTH DAILY., Disp: 90 tablet, Rfl: 0 .  metFORMIN (GLUCOPHAGE) 500 MG tablet, Take 3 tablets in pm with dinner, Disp: 270 tablet, Rfl: 1 .  pantoprazole (PROTONIX) 20 MG tablet, Take 1 tablet (20 mg total) by mouth daily., Disp: 90 tablet, Rfl: 0  EXAM:  Vitals:   09/26/16 1454  BP: 130/60  Pulse: 89  Temp: 98.3 F (36.8 C)  TempSrc: Oral  Weight: 198 lb 14.4 oz (90.2 kg)  Height: _0  (1.727 m)    Estimated body mass index is 30.24 kg/m as calculated from the following:   Height as of this encounter: _1  (1.727 m).   Weight as of this encounter: 198 lb 14.4 oz (90.2  kg).  GENERAL: vitals reviewed and listed below, alert, oriented, appears well hydrated and in no acute distress  HEENT: head atraumatic, PERRLA, normal appearance of eyes, ears, nose and mouth. moist mucus membranes.  NECK: supple, no masses or lymphadenopathy  LUNGS: clear to auscultation bilaterally, no rales, rhonchi or wheeze  CV: HRRR, bilat 1+ ankle edema,  no cyanosis, normal pedal pulses  ABDOMEN: bowel sounds normal, soft, non tender to palpation, no masses, no rebound or guarding  GU/DRE: declined  SKIN: no rash or abnormal lesions  MS: normal gait, moves all extremities normally  NEURO: normal gait, speech and thought processing grossly intact, muscle tone grossly intact throughout  PSYCH: normal affect, pleasant and cooperative  ASSESSMENT AND PLAN:  Discussed the following assessment and plan:  Encounter for preventive health examination - Plan: PSA, CANCELED: PSA  Diabetes mellitus type 2 with neurological manifestations ([place])  Peripheral edema  Essential hypertension - Plan: Basic metabolic panel, Basic metabolic panel, CANCELED: Basic metabolic panel  Benign prostatic hyperplasia with lower urinary tract symptoms, symptom details unspecified  Need for Tdap vaccination - Plan: Tdap vaccine greater than or equal to 7yo IM   -Discussed and advised all [place] preventive services health task force level A and B recommendations for age, sex and risks.  --Advised at least 150 minutes of exercise per week and a healthy diet with avoidance of (less then 1 serving per week) processed foods, white starches, red meat, fast foods and sweets and consisting of: * 5-9 servings of fresh fruits and vegetables (not corn or potatoes) *nuts and seeds, beans *olives and olive oil *lean meats such as fish and white chicken  *whole grains  -labs, studies and vaccines per orders this encounter   Patient advised to return to clinic immediately if symptoms worsen or persist or  new concerns.  Patient Instructions  BEFORE YOU LEAVE: -Tdap -labs -obtain eye report -follow up: 3 months and further discussion colon cancer screening then  Compression socks  Continue all medications.  -We placed a referral for you as discussed to the podiatrist. It usually takes about 1-2 weeks to process and schedule this referral. If you have not heard from [place] regarding this appointment in 2 weeks please contact our office.   We recommend the following healthy lifestyle for LIFE: 1) Small portions.   Tip: eat off of a salad plate instead of a dinner plate.  Tip: if you need more or a snack choose fruits, veggies and/or a handful of nuts or seeds.  2) Eat a healthy clean diet.  * Tip: Avoid (less then 1 serving per week): processed foods, sweets, sweetened drinks, white starches (rice, flour, bread, potatoes, pasta, etc), red meat, fast foods, butter  *Tip: CHOOSE instead   * 5-9 servings  per day of fresh or frozen fruits and vegetables (but not corn, potatoes, bananas, canned or dried fruit)   *nuts and seeds, beans   *olives and olive oil   *small portions of lean meats such as fish and white chicken    *small portions of whole grains  3)Get at least 150 minutes of sweaty aerobic exercise per week.  4)Reduce stress - consider counseling, meditation and relaxation to balance other aspects of your life.    No Follow-up on file.   Colin Benton R., DO

## 2016-09-26 NOTE — Telephone Encounter (Signed)
Marisue IvanLiz from Dr Allena KatzPatel office ask you to give her a call.[phone]

## 2016-09-26 NOTE — Patient Instructions (Signed)
BEFORE YOU LEAVE: -Tdap -labs -obtain eye report -follow up: 3 months and further discussion colon cancer screening then  Compression socks  Continue all medications.  -We placed a referral for you as discussed to the podiatrist. It usually takes about 1-2 weeks to process and schedule this referral. If you have not heard from us regarding this appointment in 2 weeks please contact our office.   We recommend the following healthy lifestyle for LIFE: 1) Small portions.   Tip: eat off of a salad plate instead of a dinner plate.  Tip: if you need more or a snack choose fruits, veggies and/or a handful of nuts or seeds.  2) Eat a healthy clean diet.  * Tip: Avoid (less then 1 serving per week): processed foods, sweets, sweetened drinks, white starches (rice, flour, bread, potatoes, pasta, etc), red meat, fast foods, butter  *Tip: CHOOSE instead   * 5-9 servings per day of fresh or frozen fruits and vegetables (but not corn, potatoes, bananas, canned or dried fruit)   *nuts and seeds, beans   *olives and olive oil   *small portions of lean meats such as fish and white chicken    *small portions of whole grains  3)Get at least 150 minutes of sweaty aerobic exercise per week.  4)Reduce stress - consider counseling, meditation and relaxation to balance other aspects of your life.

## 2016-09-26 NOTE — Telephone Encounter (Addendum)
Requested a call back from Marisue IvanLiz to discuss further.

## 2016-09-26 NOTE — Progress Notes (Signed)
Pre visit review using our clinic review tool, if applicable. No additional management support is needed unless otherwise documented below in the visit note. 

## 2016-09-27 LAB — BASIC METABOLIC PANEL
BUN: 29 mg/dL — ABNORMAL HIGH (ref 7–25)
CALCIUM: 9.4 mg/dL (ref 8.6–10.3)
CHLORIDE: 104 mmol/L (ref 98–110)
CO2: 27 mmol/L (ref 20–31)
CREATININE: 1.86 mg/dL — AB (ref 0.70–1.33)
Glucose, Bld: 146 mg/dL — ABNORMAL HIGH (ref 65–99)
Potassium: 4.7 mmol/L (ref 3.5–5.3)
Sodium: 139 mmol/L (ref 135–146)

## 2016-09-27 LAB — PSA: PSA: 0.4 ng/mL (ref <=4.0)

## 2016-10-09 ENCOUNTER — Other Ambulatory Visit: Payer: Self-pay | Admitting: Family Medicine

## 2016-10-18 ENCOUNTER — Other Ambulatory Visit: Payer: Self-pay | Admitting: Internal Medicine

## 2016-10-24 ENCOUNTER — Other Ambulatory Visit: Payer: Self-pay | Admitting: Internal Medicine

## 2016-10-28 ENCOUNTER — Telehealth: Payer: Self-pay | Admitting: Emergency Medicine

## 2016-10-28 NOTE — Telephone Encounter (Signed)
Is this okay to refill? I didn't see it in the patient meds list.  Tamsulosin HCL 0.4 MG Capsule.    [facility] [address]

## 2016-10-30 ENCOUNTER — Other Ambulatory Visit: Payer: Self-pay | Admitting: Emergency Medicine

## 2016-10-30 MED ORDER — TAMSULOSIN HCL 0.4 MG PO CAPS: 0.4000 mg | ORAL_CAPSULE | Freq: Every day | ORAL | 3 refills | Status: DC

## 2016-10-30 NOTE — Telephone Encounter (Signed)
Ok to refill x 1 year 

## 2016-11-07 ENCOUNTER — Other Ambulatory Visit: Payer: Self-pay | Admitting: Internal Medicine

## 2016-11-07 ENCOUNTER — Other Ambulatory Visit: Payer: Self-pay | Admitting: Family Medicine

## 2016-11-07 DIAGNOSIS — E1149 Type 2 diabetes mellitus with other diabetic neurological complication: Secondary | ICD-10-CM

## 2016-11-21 ENCOUNTER — Ambulatory Visit (INDEPENDENT_AMBULATORY_CARE_PROVIDER_SITE_OTHER): Payer: BLUE CROSS/BLUE SHIELD | Admitting: Internal Medicine

## 2016-11-21 ENCOUNTER — Encounter: Payer: Self-pay | Admitting: Internal Medicine

## 2016-11-21 VITALS — BP 134/78 | HR 90 | Ht 68.5 in | Wt 197.0 lb

## 2016-11-21 DIAGNOSIS — E1149 Type 2 diabetes mellitus with other diabetic neurological complication: Secondary | ICD-10-CM

## 2016-11-21 LAB — POCT GLYCOSYLATED HEMOGLOBIN (HGB A1C): HEMOGLOBIN A1C: 7

## 2016-11-21 NOTE — Patient Instructions (Addendum)
Please continue:  - Metformin 1500 mg with dinner.  - Lantus 38 units at bedtime - NovoLog:  15 units before breakfast  6-8 units before lunch  15 units before dinner  Try to move dinner earlier. If you cannot do this or if the sugars in am do not improve, please increase Lantus to 42 units.  Please return in 3 months with your sugar log.

## 2016-11-21 NOTE — Addendum Note (Signed)
Addended by: Darene LamerHOMPSON, Shalin Linders T on: 11/21/2016 04:49 PM   Modules accepted: Orders

## 2016-11-21 NOTE — Progress Notes (Signed)
Patient ID: Patrick Hernandez, male   DOB: August 13, 1956, 61 y.o.   MRN: [id number]  HPI: Patrick Hernandez is a 61 y.o.-year-old male, returning for f/u for DM2, dx 2000, insulin-dependent since 2013, uncontrolled, with complications (+ DR). Last visit 3 mo ago.  He had a URI >> cough meds >> sugars higher in am  DM2: Last hemoglobin A1c was: Lab Results  Component Value Date   HGBA1C 7.2 08/22/2016   HGBA1C 7.4 05/23/2016   HGBA1C 6.7 02/14/2016  Improvement in diet and starting to take the insulin consistently >> improved HbA1c.  Pt is on a regimen of: - Metformin 1500 mg with dinner. - Lantus 38 units at bedtime.  - NovoLog:  15 units before breakfast  6-8 units before lunch  15 units before dinner  Pt checks his sugars 2-3x a day and they are still high in am as he is eating dinner late (comes home ~ 7 pm and may eat dinner as late as 11 pm): - am: 134-171, 200 >> 98, 108-165, 180 (higher when dinner is late) >> 93, 140-183, 189 - 2-3h after b'fast: 80-180 >> 73-140 >> 95-128 >> 95-135, 181 >> 73-164 >> 88-179, 195 >> n/c  - before lunch: n/c >> 80-166 >> 73, 82-139 >> 88-130, 145 >> 80-147, 160 >> 77, 92-136, 159, 171 - 2h after lunch: n/c >> 135-165 >> n/c >> 116-172 >> n/c  - before dinner: 89-165 >> 104-145, 190 >> 97-135, 145 >> 83-126, 147 >> 121-141 - 2h after dinner: n/c >> 54 (if takes 12 units NovoLog) - 170 >> 84-145 >> n/c  - bedtime: n/c >> 101-150, 175 >> 116-140 >> 110-160 >> 87 >> 60 x1, 80-146 - nighttime: n/c No lows. Lowest sugar was 88 >> 83 >> 60; he has hypoglycemia awareness at 90.  Highest sugar was 200 >> 180 >> 189.  Pt's meals are: - Breakfast: brown rice, beans, soup (tofu, vegetables) - Lunch: same - Dinner: same, salads - Snacks: none  - Has CKD, last BUN/creatinine:  Lab Results  Component Value Date   BUN 29 (H) 09/26/2016   CREATININE 1.86 (H) 09/26/2016  On Avapro.  Creatinine increased at last check: Lab Results  Component Value Date   CREATININE 1.86 (H) 09/26/2016   CREATININE 1.91 (H) 07/15/2016   CREATININE 1.46 (H) 02/14/2016   CREATININE 1.44 02/07/2015   CREATININE 1.4 (A) 12/15/2014   - last set of lipids: Lab Results  Component Value Date   CHOL 98 02/14/2016   HDL 29.40 (L) 02/14/2016   LDLCALC 56 09/15/2014   LDLDIRECT 39.0 02/14/2016   TRIG 279.0 (H) 02/14/2016   CHOLHDL 3 02/14/2016  On Lipitor. - last eye exam was in 09/2016. +DR. He had Laser Sx in the past.  Now getting IO injections. - + numbness and tingling in his feet - 1rst-3rd toes bilaterally. He is on ASA 81.  He also has a history of BPH, GERD, HTN.  I reviewed pt's medications, allergies, PMH, social hx, family hx, and changes were documented in the history of present illness. Otherwise, unchanged from my initial visit note.  ROS: Constitutional: no weight gain/loss, no fatigue, no subjective hyperthermia/hypothermia Eyes: no blurry vision, no xerophthalmia ENT: no sore throat, no nodules palpated in throat, no dysphagia/odynophagia, no hoarseness Cardiovascular: no CP/SOB/palpitations/leg swelling Respiratory: no cough/SOB Gastrointestinal: no N/V/D/C, no heartburn Musculoskeletal: no muscle/joint aches Skin: no rashes Neurological: no tremors/numbness/tingling/dizziness  PE: BP 134/78 (BP Location: Left Arm, Patient Position: Sitting)  Pulse 90   Ht 5' 8.5" (1.74 m)   Wt 197 lb (89.4 kg)   SpO2 94%   BMI 29.52 kg/m  Body mass index is 29.52 kg/m.   Wt Readings from Last 3 Encounters:  11/21/16 197 lb (89.4 kg)  09/26/16 198 lb 14.4 oz (90.2 kg)  08/22/16 199 lb (90.3 kg)   Constitutional: overweight, in NAD Eyes: PERRLA, EOMI, no exophthalmos ENT: moist mucous membranes, no thyromegaly, no cervical lymphadenopathy Cardiovascular: RRR, No MRG, no edema Respiratory: CTA B Gastrointestinal: abdomen soft, NT, ND, BS+ Musculoskeletal: no deformities, strength intact in all 4 Skin: moist, warm, no rashes Neurological:  no tremor with outstretched hands, DTR normal in all 4  ASSESSMENT: 1. DM2, insulin-dependent, uncontrolled, without complications - ? PAD (decreased pulses in LE) - DR  PLAN:  1. Patient with long-standing, recently more controlled diabetes, on basal-bolus insulin regimen + metformin. His sugars are still higher in am, as he eats dinner late >> advised him to try to move dinner earlier, but if he cannot do this or if sugars do not improve >> he will need to increase Lantus.  - I suggested to:  Patient Instructions  Please continue:  - Metformin 1500 mg with dinner.  - Lantus 38 units at bedtime - NovoLog:  15 units before breakfast  6-8 units before lunch  15 units before dinner  Try to move dinner earlier. If you cannot do this or if the sugars in am do not improve, please increase Lantus to 42 units.  Please return in 3 months with your sugar log.   - continue checking sugars at different times of the day - check 2-3 times a day, but mostly before meals, rotating checks - given new sugar logs - He had a new yearly eye exam >> UTD - we reviewed together his kidney tests > stable, but still low. Discussed avoiding salt and NSAIDs. - checked Hba1c today >> 7.0% (better) - Return to clinic in 3 mo with sugar log  Contact:  Lafonda MossesDiana (daughter) [facility] [phone]: [phone]  Carlus Pavlovristina Odessie Polzin, MD PhD [facility]

## 2016-12-08 ENCOUNTER — Telehealth: Payer: Self-pay | Admitting: *Deleted

## 2016-12-08 ENCOUNTER — Other Ambulatory Visit: Payer: Self-pay | Admitting: Emergency Medicine

## 2016-12-08 NOTE — Telephone Encounter (Signed)
[facility] faxed a refill request for Metoprolol Succ ER 100mg -take 1 tablet by mouth every day-#30. I did not see this on the pts medication list.

## 2016-12-09 ENCOUNTER — Telehealth: Payer: Self-pay | Admitting: Family Medicine

## 2016-12-09 NOTE — Telephone Encounter (Signed)
I called the pt and attempted to inform him of the message below and he did not understand due to the language barrier.  I called [facility] and informed the pharmacist Esmeralda ArthurKimber of the message below.

## 2016-12-09 NOTE — Telephone Encounter (Signed)
Pt daughter is call concerning metoprolol and would like a callback. Patrick MossesDiana would like to know should her dad be taking metoprolol

## 2016-12-09 NOTE — Telephone Encounter (Signed)
He stopped that medication last year and we did not need to restart. If he feels needs to restart, please schedule OV to eval first. Thanks.

## 2016-12-09 NOTE — Telephone Encounter (Signed)
I left a detailed message for the pts daughter stating our office received a [facility] from [facility] for a refill on Metoprolol and per Dr Selena BattenKim the pt discontinued the medication and if he feels he still needs this to come in for an office visit.

## 2016-12-20 ENCOUNTER — Other Ambulatory Visit: Payer: Self-pay | Admitting: Internal Medicine

## 2016-12-24 NOTE — Telephone Encounter (Signed)
Pharmacy called for pt to request a refill of BD PEN NEEDLE NANO U/F 32G X 4 MM MISC

## 2016-12-24 NOTE — Telephone Encounter (Signed)
Pt also hctz 12.5 mg #30 w/refills

## 2016-12-25 ENCOUNTER — Other Ambulatory Visit: Payer: Self-pay | Admitting: *Deleted

## 2016-12-25 MED ORDER — HYDROCHLOROTHIAZIDE 12.5 MG PO TABS: 12.5000 mg | ORAL_TABLET | Freq: Every day | ORAL | 1 refills | Status: DC

## 2016-12-25 NOTE — Telephone Encounter (Signed)
Rx done. 

## 2016-12-25 NOTE — Telephone Encounter (Signed)
Rxs done. 

## 2016-12-26 ENCOUNTER — Ambulatory Visit (INDEPENDENT_AMBULATORY_CARE_PROVIDER_SITE_OTHER): Payer: BLUE CROSS/BLUE SHIELD | Admitting: Family Medicine

## 2016-12-26 ENCOUNTER — Encounter: Payer: Self-pay | Admitting: Family Medicine

## 2016-12-26 VITALS — BP 142/70 | HR 90 | Temp 98.7°F | Ht 68.5 in | Wt 200.6 lb

## 2016-12-26 DIAGNOSIS — I10 Essential (primary) hypertension: Secondary | ICD-10-CM | POA: Diagnosis not present

## 2016-12-26 DIAGNOSIS — E6609 Other obesity due to excess calories: Secondary | ICD-10-CM | POA: Diagnosis not present

## 2016-12-26 DIAGNOSIS — E785 Hyperlipidemia, unspecified: Secondary | ICD-10-CM

## 2016-12-26 DIAGNOSIS — E1169 Type 2 diabetes mellitus with other specified complication: Secondary | ICD-10-CM

## 2016-12-26 DIAGNOSIS — E1149 Type 2 diabetes mellitus with other diabetic neurological complication: Secondary | ICD-10-CM

## 2016-12-26 DIAGNOSIS — Z6830 Body mass index (BMI) 30.0-30.9, adult: Secondary | ICD-10-CM

## 2016-12-26 MED ORDER — HYDROCHLOROTHIAZIDE 25 MG PO TABS: 25.0000 mg | ORAL_TABLET | Freq: Every day | ORAL | 3 refills | Status: DC

## 2016-12-26 NOTE — Progress Notes (Signed)
HPI:  Patrick Hernandez is a pleasant 61 yo with a complicated PMH significant for poor compliance, HTN, HLD, CKD, circulatory issues and BPH here for follow up. Sees endocrinologist and opthomologist. Hgba1c 7.0 11/21/16. Working with endocrinologist to improve diabetes. Reports doing well without complaints. Denies CP, SOB, DOE, swelling. Due for colon cancer screening - he refused at last visit. Today agrees to cologard after further discussion.  ROS: See pertinent positives and negatives per HPI.  Past Medical History:  Diagnosis Date  . 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 Hypertension   . Palpitations   . Polyneuropathy in diabetes(357.2)   . Type II or unspecified type diabetes mellitus with neurological manifestations, not stated as uncontrolled(250.60)     Past Surgical History:  Procedure Laterality Date  . None      Family History  Problem Relation Age of Onset  . Diabetes Mother   . Diabetes Father   . Heart disease Father     Pacemaker    Social History   Social History  . Marital status: Married    Spouse name: N/A  . Number of children: N/A  . Years of education: N/A   Occupational History  . Self employed    Social History Main Topics  . Smoking status: Never Smoker  . Smokeless tobacco: Never Used  . Alcohol use No  . Drug use: No  . Sexual activity: Not Asked   Other Topics Concern  . 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  acetaminophen-codeine (TYLENOL #3) 300-30 MG tablet, , Disp: , Rfl: 0 .  amLODipine (NORVASC) 10 MG tablet, TAKE 1 TABLET (10 MG TOTAL) BY MOUTH DAILY., Disp: 30 tablet, Rfl: 4 .  aspirin 81 MG tablet, Take 81 mg by mouth daily., Disp: , Rfl:  .  atorvastatin (LIPITOR) 40 MG tablet, TAKE 1 TABLET (40 MG TOTAL) BY MOUTH DAILY., Disp: 90 tablet, Rfl: 1 .  BAYER MICROLET LANCETS lancets, Use to test blood sugar 3 times daily as instructed. Dx:  E11.40, Disp: 100 each, Rfl: 11 .  BD PEN NEEDLE NANO U/F 32G X 4 MM MISC, USE 4 TIMES A DAY, Disp: 100 each, Rfl: 11 .  Blood Glucose Monitoring Suppl (BAYER CONTOUR NEXT MONITOR) w/Device KIT, Use to test blood sugar daily as instructed. Dx: E11.40, Disp: 1 kit, Rfl: 0 .  glucose blood (BAYER CONTOUR NEXT TEST) test strip, Use to test blood sugar 3 times daily as instructed. Dx: E11.40, Disp: 100 each, Rfl: 11 .  hydrochlorothiazide (HYDRODIURIL) 25 MG tablet, Take 1 tablet (25 mg total) by mouth daily., Disp: 90 tablet, Rfl: 3 .  insulin aspart (NOVOLOG FLEXPEN) 100 UNIT/ML FlexPen, Inject up to 36 units daily as instructed., Disp: 30 mL, Rfl: 1 .  irbesartan (AVAPRO) 150 MG tablet, TAKE ONE TABLET BY MOUTH DAILY, Disp: 90 tablet, Rfl: 2 .  LANTUS SOLOSTAR 100 UNIT/ML Solostar Pen, INJECT 0.3 MLS (30 UNITS TOTAL) INTO THE SKIN AT BEDTIME., Disp: 15 pen, Rfl: 3 .  metFORMIN (GLUCOPHAGE) 500 MG tablet, Take 3 tablets in pm with dinner, Disp: 270 tablet, Rfl: 1 .  pantoprazole (PROTONIX) 20 MG tablet, TAKE ONE TABLET BY MOUTH DAILY, Disp: 90 tablet, Rfl: 2 .  tamsulosin (FLOMAX) 0.4 MG CAPS capsule, Take 1 capsule (0.4 mg total) by mouth daily., Disp: 30 capsule, Rfl: 3  EXAM:  Vitals:   12/26/16 1456  BP: (!) 142/70  Pulse: 90  Temp: 98.7 F (37.1 C)    Body mass index is 30.06 kg/m.  GENERAL: vitals reviewed and listed above, alert, oriented, appears well hydrated and in no acute distress  HEENT: atraumatic, conjunttiva clear, no obvious abnormalities on inspection of external nose and ears  NECK: no obvious masses on inspection  LUNGS: clear to auscultation bilaterally, no wheezes, rales or rhonchi, good air movement  CV: HRRR, no peripheral edema  MS: moves all extremities without noticeable abnormality  PSYCH: pleasant and cooperative, no obvious depression or anxiety  ASSESSMENT AND PLAN:  Discussed the following assessment and plan:  Essential  hypertension  Diabetes mellitus type 2 with neurological manifestations (HCC)  Hyperlipidemia associated with type 2 diabetes mellitus (HCC)  Class 1 obesity due to excess calories with serious comorbidity and body mass index (BMI) of 30.0 to 30.9 in adult  -lifestyle recs -increase hctz from 12.5-25 to improve BP control -lengthy discussion on options for colon cancer screening which he refused in the past - he agreed to cologard today -labs at follow up in 3 months -Patient advised to return or notify a doctor immediately if symptoms worsen or persist or new concerns arise.  Patient Instructions  BEFORE YOU LEAVE: -order cologard -follow up: 3 months  We ordered the Cologuard test for colon cancer screening. Please complete this test promptly once the kit arrives. Please contact us if you have not received your kit in the next few weeks.  MEDICATION CHANGES: INCREASE the Hydrochlorothiazide (HCTZ) to 25 mg daily. I sent a new prescription to the pharmacy.   We recommend the following healthy lifestyle for LIFE: 1) Small portions.   Tip: eat off of a salad plate instead of a dinner plate.  Tip: if you need more or a snack choose fruits, veggies and/or a handful of nuts or seeds.  2) Eat a healthy clean diet.  * Tip: Avoid (less then 1 serving per week): processed foods, sweets, sweetened drinks, white starches (rice, flour, bread, potatoes, pasta, etc), red meat, fast foods, butter  *Tip: CHOOSE instead   * 5-9 servings per day of fresh or frozen fruits and vegetables (but not corn, potatoes, bananas, canned or dried fruit)   *nuts and seeds, beans   *olives and olive oil   *small portions of lean meats such as fish and white chicken    *small portions of whole grains  3)Get at least 150 minutes of sweaty aerobic exercise per week.  4)Reduce stress - consider counseling, meditation and relaxation to balance other aspects of your life.     Colin Benton R., DO

## 2016-12-26 NOTE — Patient Instructions (Signed)
BEFORE YOU LEAVE: -order cologard -follow up: 3 months  We ordered the Cologuard test for colon cancer screening. Please complete this test promptly once the kit arrives. Please contact us if you have not received your kit in the next few weeks.  MEDICATION CHANGES: INCREASE the Hydrochlorothiazide (HCTZ) to 25 mg daily. I sent a new prescription to the pharmacy.   We recommend the following healthy lifestyle for LIFE: 1) Small portions.   Tip: eat off of a salad plate instead of a dinner plate.  Tip: if you need more or a snack choose fruits, veggies and/or a handful of nuts or seeds.  2) Eat a healthy clean diet.  * Tip: Avoid (less then 1 serving per week): processed foods, sweets, sweetened drinks, white starches (rice, flour, bread, potatoes, pasta, etc), red meat, fast foods, butter  *Tip: CHOOSE instead   * 5-9 servings per day of fresh or frozen fruits and vegetables (but not corn, potatoes, bananas, canned or dried fruit)   *nuts and seeds, beans   *olives and olive oil   *small portions of lean meats such as fish and white chicken    *small portions of whole grains  3)Get at least 150 minutes of sweaty aerobic exercise per week.  4)Reduce stress - consider counseling, meditation and relaxation to balance other aspects of your life.

## 2016-12-26 NOTE — Progress Notes (Signed)
Pre visit review using our clinic review tool, if applicable. No additional management support is needed unless otherwise documented below in the visit note. 

## 2017-01-06 LAB — COLOGUARD: Cologuard: NEGATIVE

## 2017-01-13 ENCOUNTER — Other Ambulatory Visit: Payer: Self-pay

## 2017-01-13 DIAGNOSIS — E1149 Type 2 diabetes mellitus with other diabetic neurological complication: Secondary | ICD-10-CM

## 2017-01-13 MED ORDER — METFORMIN HCL 500 MG PO TABS: ORAL_TABLET | ORAL | 1 refills | Status: DC

## 2017-01-21 ENCOUNTER — Encounter: Payer: Self-pay | Admitting: Family Medicine

## 2017-02-12 ENCOUNTER — Other Ambulatory Visit: Payer: Self-pay | Admitting: *Deleted

## 2017-02-12 MED ORDER — PANTOPRAZOLE SODIUM 20 MG PO TBEC: 20.0000 mg | DELAYED_RELEASE_TABLET | Freq: Every day | ORAL | 1 refills | Status: DC

## 2017-02-20 ENCOUNTER — Ambulatory Visit: Payer: BLUE CROSS/BLUE SHIELD | Admitting: Internal Medicine

## 2017-02-24 ENCOUNTER — Other Ambulatory Visit: Payer: Self-pay | Admitting: Internal Medicine

## 2017-02-24 DIAGNOSIS — E1149 Type 2 diabetes mellitus with other diabetic neurological complication: Secondary | ICD-10-CM

## 2017-02-24 MED ORDER — INSULIN ASPART 100 UNIT/ML FLEXPEN: PEN_INJECTOR | SUBCUTANEOUS | 1 refills | Status: DC

## 2017-02-26 ENCOUNTER — Other Ambulatory Visit: Payer: Self-pay | Admitting: Internal Medicine

## 2017-03-05 ENCOUNTER — Other Ambulatory Visit: Payer: Self-pay | Admitting: *Deleted

## 2017-03-05 MED ORDER — ATORVASTATIN CALCIUM 40 MG PO TABS: ORAL_TABLET | ORAL | 1 refills | Status: DC

## 2017-03-05 NOTE — Telephone Encounter (Signed)
Rx done. 

## 2017-03-08 ENCOUNTER — Other Ambulatory Visit: Payer: Self-pay | Admitting: Family Medicine

## 2017-03-21 ENCOUNTER — Other Ambulatory Visit: Payer: Self-pay | Admitting: Family Medicine

## 2017-03-24 ENCOUNTER — Other Ambulatory Visit: Payer: Self-pay | Admitting: Family Medicine

## 2017-03-24 DIAGNOSIS — E1149 Type 2 diabetes mellitus with other diabetic neurological complication: Secondary | ICD-10-CM

## 2017-03-26 ENCOUNTER — Telehealth: Payer: Self-pay | Admitting: Family Medicine

## 2017-03-26 NOTE — Telephone Encounter (Signed)
I called Patrick Hernandez and he stated he was calling about the pts insulin.  I advised he contact Dr Charlean SanfilippoGherghe's office and he agreed.

## 2017-03-26 NOTE — Telephone Encounter (Signed)
Hassie BruceJoanne Todd from [facility] need clarification on one of the pts Rx

## 2017-03-27 ENCOUNTER — Encounter: Payer: Self-pay | Admitting: Family Medicine

## 2017-03-27 ENCOUNTER — Ambulatory Visit (INDEPENDENT_AMBULATORY_CARE_PROVIDER_SITE_OTHER): Payer: BLUE CROSS/BLUE SHIELD | Admitting: Family Medicine

## 2017-03-27 VITALS — BP 122/82 | HR 97 | Temp 98.3°F | Ht 68.5 in | Wt 200.7 lb

## 2017-03-27 DIAGNOSIS — Z7289 Other problems related to lifestyle: Secondary | ICD-10-CM | POA: Diagnosis not present

## 2017-03-27 DIAGNOSIS — E1149 Type 2 diabetes mellitus with other diabetic neurological complication: Secondary | ICD-10-CM | POA: Diagnosis not present

## 2017-03-27 DIAGNOSIS — I10 Essential (primary) hypertension: Secondary | ICD-10-CM | POA: Diagnosis not present

## 2017-03-27 DIAGNOSIS — N401 Enlarged prostate with lower urinary tract symptoms: Secondary | ICD-10-CM | POA: Diagnosis not present

## 2017-03-27 LAB — HEMOGLOBIN A1C: HEMOGLOBIN A1C: 7 % — AB (ref 4.6–6.5)

## 2017-03-27 LAB — BASIC METABOLIC PANEL
BUN: 31 mg/dL — ABNORMAL HIGH (ref 6–23)
CALCIUM: 9.4 mg/dL (ref 8.4–10.5)
CO2: 25 meq/L (ref 19–32)
CREATININE: 1.75 mg/dL — AB (ref 0.40–1.50)
Chloride: 103 mEq/L (ref 96–112)
GFR: 42.43 mL/min — AB (ref >60.00)
GLUCOSE: 313 mg/dL — AB (ref 70–99)
Potassium: 4.7 mEq/L (ref 3.5–5.1)
Sodium: 136 mEq/L (ref 135–145)

## 2017-03-27 LAB — CBC
HCT: 38.6 % — ABNORMAL LOW (ref 39.0–52.0)
Hemoglobin: 13.2 g/dL (ref 13.0–17.0)
MCHC: 34.3 g/dL (ref 30.0–36.0)
MCV: 87.5 fl (ref 78.0–100.0)
Platelets: 302 10*3/uL (ref 150.0–400.0)
RBC: 4.42 Mil/uL (ref 4.22–5.81)
RDW: 13.3 % (ref 11.5–15.5)
WBC: 7.2 10*3/uL (ref 4.0–10.5)

## 2017-03-27 NOTE — Patient Instructions (Signed)
BEFORE YOU LEAVE: -follow up: 3-4 months -labs  We have ordered labs or studies at this visit. It can take up to 1-2 weeks for results and processing. IF results require follow up or explanation, we will call you with instructions. Clinically stable results will be released to your MYCHART. If you have not heard from us or cannot find your results in MYCHART in 2 weeks please contact our office at [phone].  If you are not yet signed up for MYCHART, please consider signing up.   We recommend the following healthy lifestyle for LIFE: 1) Small portions.   Tip: eat off of a salad plate instead of a dinner plate.  Tip: if you need more or a snack choose fruits, veggies and/or a handful of nuts or seeds.  2) Eat a healthy clean diet.  * Tip: Avoid (less then 1 serving per week): processed foods, sweets, sweetened drinks, white starches (rice, flour, bread, potatoes, pasta, etc), red meat, fast foods, butter  *Tip: CHOOSE instead   * 5-9 servings per day of fresh or frozen fruits and vegetables (but not corn, potatoes, bananas, canned or dried fruit)   *nuts and seeds, beans   *olives and olive oil   *small portions of lean meats such as fish and white chicken    *small portions of whole grains  3)Get at least 150 minutes of sweaty aerobic exercise per week.  4)Reduce stress - consider counseling, meditation and relaxation to balance other aspects of your life.          

## 2017-03-27 NOTE — Progress Notes (Signed)
HPI:  Follow up DM, HTN, HLD, CKD, circulatory issues, BPH . Hx poor compliance. Sees endo for the diabetes. Cologard was negative. Due for labs for BP and endo follow up (scheduled later this month). No regular aerobic exercise. Reports has changed to brown rice and is trying to eat more veggies. No CP, SOB, DOE, change in urination.  ROS: See pertinent positives and negatives per HPI.  Past Medical History:  Diagnosis Date  . 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 Hypertension   . Palpitations   . Polyneuropathy in diabetes(357.2)   . Type II or unspecified type diabetes mellitus with neurological manifestations, not stated as uncontrolled(250.60)     Past Surgical History:  Procedure Laterality Date  . None      Family History  Problem Relation Age of Onset  . Diabetes Mother   . Diabetes Father   . Heart disease Father        Pacemaker    Social History   Social History  . Marital status: Married    Spouse name: N/A  . Number of children: N/A  . Years of education: N/A   Occupational History  . Self employed    Social History Main Topics  . Smoking status: Never Smoker  . Smokeless tobacco: Never Used  . Alcohol use No  . Drug use: No  . Sexual activity: Not Asked   Other Topics Concern  . 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  acetaminophen-codeine (TYLENOL #3) 300-30 MG tablet, , Disp: , Rfl: 0 .  amLODipine (NORVASC) 10 MG tablet, TAKE 1 TABLET BY MOUTH EVERY DAY, Disp: 30 tablet, Rfl: 5 .  aspirin 81 MG tablet, Take 81 mg by mouth daily., Disp: , Rfl:  .  atorvastatin (LIPITOR) 40 MG tablet, TAKE 1 TABLET (40 MG TOTAL) BY MOUTH DAILY., Disp: 90 tablet, Rfl: 1 .  BAYER CONTOUR NEXT TEST test strip, TEST BLOOD SUGAR 3 TIMES A DAY AS INSTRUCTED, Disp: 100 each, Rfl: 2 .  BAYER MICROLET LANCETS lancets, Use to test blood sugar 3 times daily as instructed. Dx: E11.40,  Disp: 100 each, Rfl: 11 .  BD PEN NEEDLE NANO U/F 32G X 4 MM MISC, USE 4 TIMES A DAY, Disp: 100 each, Rfl: 11 .  Blood Glucose Monitoring Suppl (BAYER CONTOUR NEXT MONITOR) w/Device KIT, Use to test blood sugar daily as instructed. Dx: E11.40, Disp: 1 kit, Rfl: 0 .  hydrochlorothiazide (HYDRODIURIL) 25 MG tablet, Take 1 tablet (25 mg total) by mouth daily., Disp: 90 tablet, Rfl: 3 .  insulin aspart (NOVOLOG FLEXPEN) 100 UNIT/ML FlexPen, Inject up to 36 units daily as instructed., Disp: 30 mL, Rfl: 1 .  irbesartan (AVAPRO) 150 MG tablet, TAKE ONE TABLET BY MOUTH DAILY, Disp: 90 tablet, Rfl: 2 .  LANTUS SOLOSTAR 100 UNIT/ML Solostar Pen, INJECT 0.3 MLS (30 UNITS TOTAL) INTO THE SKIN AT BEDTIME., Disp: 15 pen, Rfl: 3 .  metFORMIN (GLUCOPHAGE) 500 MG tablet, Take 3 tablets in pm with dinner, Disp: 270 tablet, Rfl: 1 .  pantoprazole (PROTONIX) 20 MG tablet, Take 1 tablet (20 mg total) by mouth daily., Disp: 90 tablet, Rfl: 1 .  tamsulosin (FLOMAX) 0.4 MG CAPS capsule, TAKE ONE CAPSULE BY MOUTH EVERY DAY, Disp: 30 capsule, Rfl: 5  EXAM:  Vitals:   03/27/17 1444  BP: 122/82  Pulse: 97  Temp: 98.3 F (36.8 C)  Body mass index is 30.07 kg/m.  GENERAL: vitals reviewed and listed above, alert, oriented, appears well hydrated and in no acute distress  HEENT: atraumatic, conjunttiva clear, no obvious abnormalities on inspection of external nose and ears  NECK: no obvious masses on inspection  LUNGS: clear to auscultation bilaterally, no wheezes, rales or rhonchi, good air movement  CV: HRRR, no peripheral edema  MS: moves all extremities without noticeable abnormality  PSYCH: pleasant and cooperative, no obvious depression or anxiety  ASSESSMENT AND PLAN:  Discussed the following assessment and plan:  Diabetes mellitus type 2 with neurological manifestations (HCC) - Plan: Hemoglobin A1c  Essential hypertension - Plan: Basic metabolic panel, CBC  Benign prostatic hyperplasia with  lower urinary tract symptoms, symptom details unspecified  hep c screen - Plan: Hepatitis C antibody  -lifestyle recs -cont current medications -follow up with endo as planned (will get hgba1c today per his preference since getting labs) -agreed to hep c screen for baby boomer -Patient advised to return or notify a doctor immediately if symptoms worsen or persist or new concerns arise.  Patient Instructions  BEFORE YOU LEAVE: -follow up: 3-4 months -labs  We have ordered labs or studies at this visit. It can take up to 1-2 weeks for results and processing. IF results require follow up or explanation, we will call you with instructions. Clinically stable results will be released to your [facility] If you have not heard from [place] or cannot find your results in [facility] in 2 weeks please contact our office at [phone].  If you are not yet signed up for [facility], please consider signing up.   We recommend the following healthy lifestyle for LIFE: 1) Small portions.   Tip: eat off of a salad plate instead of a dinner plate.  Tip: if you need more or a snack choose fruits, veggies and/or a handful of nuts or seeds.  2) Eat a healthy clean diet.  * Tip: Avoid (less then 1 serving per week): processed foods, sweets, sweetened drinks, white starches (rice, flour, bread, potatoes, pasta, etc), red meat, fast foods, butter  *Tip: CHOOSE instead   * 5-9 servings per day of fresh or frozen fruits and vegetables (but not corn, potatoes, bananas, canned or dried fruit)   *nuts and seeds, beans   *olives and olive oil   *small portions of lean meats such as fish and white chicken    *small portions of whole grains  3)Get at least 150 minutes of sweaty aerobic exercise per week.  4)Reduce stress - consider counseling, meditation and relaxation to balance other aspects of your life.          Colin Benton R., DO

## 2017-03-28 LAB — HEPATITIS C ANTIBODY: HCV AB: NEGATIVE

## 2017-04-03 ENCOUNTER — Ambulatory Visit (INDEPENDENT_AMBULATORY_CARE_PROVIDER_SITE_OTHER): Payer: BLUE CROSS/BLUE SHIELD | Admitting: Internal Medicine

## 2017-04-03 ENCOUNTER — Encounter: Payer: Self-pay | Admitting: Internal Medicine

## 2017-04-03 VITALS — BP 124/62 | HR 97 | Wt 199.0 lb

## 2017-04-03 DIAGNOSIS — E1149 Type 2 diabetes mellitus with other diabetic neurological complication: Secondary | ICD-10-CM

## 2017-04-03 NOTE — Patient Instructions (Addendum)
Please continue: - Metformin 1500 mg with dinner - Lantus 38 units but move this right after dinner - NovoLog:  15 units before breakfast  6-8 units before lunch  15 units before dinner  Please return in 3 months with your sugar log.

## 2017-04-03 NOTE — Progress Notes (Signed)
Patient ID: Patrick CromeHyun Kyu Roll, male   DOB: Feb 26, 1956, 61 y.o.   MRN: [id number]  HPI: Patrick Hernandez is a 61 y.o.-year-old male, returning for f/u for DM2, dx 2000, insulin-dependent since 2013, uncontrolled, with complications (+ DR). Last visit 3 mo ago.  DM2: Last hemoglobin A1c was: Lab Results  Component Value Date   HGBA1C 7.0 (H) 03/27/2017   HGBA1C 7.0 11/21/2016   HGBA1C 7.2 08/22/2016  Improvement in diet and starting to take the insulin consistently >> improved HbA1c.  Pt is on a regimen of: - Metformin 1500 mg with dinner - Lantus 38 units at bedtime.  - NovoLog:  15 units before breakfast  6-8 units before lunch  15 units before dinner  Pt checks his sugars 2-3x a day >> high in am as he comes home late from work >> dinner is very late (even 11 pm): - am: 98, 108-165, 180 >> 93, 140-183, 189 >> 117-151, 180 (forgets Lantus) - 2-3h after b'fast: 73-164 >> 88-179, 195 >> n/c  - before lunch: 80-147, 160 >> 77, 92-136, 159, 171 >> 77, 87-150, 154 - 2h after lunch: 135-165 >> n/c >> 116-172 >> n/c  - before dinner: 97-135, 145 >> 83-126, 147 >> 121-141 >> 82-135 - 2h after dinner:  54- 170 >> 84-145 >> n/c  - bedtime:  110-160 >> 87 >> 60 x1, 80-146 >> 123 - nighttime: n/c No lows. Lowest sugar was 88 >> 83 >> 60 >> 77; he has hypoglycemia awareness at 90.  Highest sugar was 200 >> 180 >> 189 >> 180  Pt's meals are: - Breakfast: brown rice, beans, soup (tofu, vegetables) - Lunch: same - Dinner: same, salads - Snacks: none  - He has CKD, last BUN/creatinine:  Lab Results  Component Value Date   BUN 31 (H) 03/27/2017   CREATININE 1.75 (H) 03/27/2017  On Avapro.  Creatinine trend: Lab Results  Component Value Date   CREATININE 1.75 (H) 03/27/2017   CREATININE 1.86 (H) 09/26/2016   CREATININE 1.91 (H) 07/15/2016   CREATININE 1.46 (H) 02/14/2016   CREATININE 1.44 02/07/2015   - last set of lipids: Lab Results  Component Value Date   CHOL 98 02/14/2016   HDL  29.40 (L) 02/14/2016   LDLCALC 56 09/15/2014   LDLDIRECT 39.0 02/14/2016   TRIG 279.0 (H) 02/14/2016   CHOLHDL 3 02/14/2016  On Lipitor. - last eye exam was in 09/2016. Has DR. Had Laser Sx in the past. Now getting IO injections. - he has numbness and tingling in his feet - 1-3rd toes bilaterally. On ASA 81.  He also has a history of BPH, GERD, HTN.  ROS: Constitutional: no weight gain/no weight loss, no fatigue, no subjective hyperthermia, no subjective hypothermia Eyes: no blurry vision, no xerophthalmia ENT: no sore throat, no nodules palpated in throat, no dysphagia, no odynophagia, no hoarseness Cardiovascular: no CP/no SOB/no palpitations/no leg swelling Respiratory: no cough/no SOB/no wheezing Gastrointestinal: no N/no V/no D/no C/no acid reflux Musculoskeletal: no muscle aches/no joint aches Skin: no rashes, no hair loss Neurological: no tremors/no numbness/no tingling/no dizziness  I reviewed pt's medications, allergies, PMH, social hx, family hx, and changes were documented in the history of present illness. Otherwise, unchanged from my initial visit note.  PE: BP 124/62 (BP Location: Left Arm, Patient Position: Sitting)   Pulse 97   Wt 199 lb (90.3 kg)   SpO2 97%   BMI 29.82 kg/m  Body mass index is 29.82 kg/m.   Wt Readings from  Last 3 Encounters:  04/03/17 199 lb (90.3 kg)  03/27/17 200 lb 11.2 oz (91 kg)  12/26/16 200 lb 9.6 oz (91 kg)   Constitutional: overweight, in NAD Eyes: PERRLA, EOMI, no exophthalmos ENT: moist mucous membranes, no thyromegaly, no cervical lymphadenopathy Cardiovascular: RRR, No MRG Respiratory: CTA B Gastrointestinal: abdomen soft, NT, ND, BS+ Musculoskeletal: no deformities, strength intact in all 4 Skin: moist, warm, no rashes Neurological: no tremor with outstretched hands, DTR normal in all 4  ASSESSMENT: 1. DM2, insulin-dependent, uncontrolled, without complications - ? PAD (decreased pulses in LE) - DR  PLAN:  1.  Patient with long-standing, fairly well controlled DM, On basal-bolus insulin regimen and also metformin. His sugars are still higher in the morning, as his dinner is late. At last visit, we discussed about either moving dinner earlier or increasing Lantus by 4 units. - he did not do this, but sugars are slightly better in am, with spikes in the 170-180. He tells me he forgets Lantus at bedtime >> will move this right after dinner. - I suggested to:  Patient Instructions  Please continue: - Metformin 1500 mg with dinner - Lantus 38 units but move this right after dinner - NovoLog:  15 units before breakfast  6-8 units before lunch  15 units before dinner  Please return in 3 months with your sugar log.    - HbA1c obtained earlier this mo was reviewed with the pt >> 7%, stable - continue checking sugars at different times of the day - check 2-3x a day, rotating checks - advised for yearly eye exams >> he is UTD - Return to clinic in 3 mo with sugar log   Contact:  Patrick Hernandez (daughter) - in [facility]: [phone]  Patrick Pavlov, MD PhD [facility] Endocrinology

## 2017-04-23 ENCOUNTER — Other Ambulatory Visit: Payer: Self-pay

## 2017-04-23 DIAGNOSIS — E1149 Type 2 diabetes mellitus with other diabetic neurological complication: Secondary | ICD-10-CM

## 2017-04-23 MED ORDER — INSULIN ASPART 100 UNIT/ML FLEXPEN: PEN_INJECTOR | SUBCUTANEOUS | 1 refills | Status: DC

## 2017-04-24 ENCOUNTER — Other Ambulatory Visit: Payer: Self-pay

## 2017-04-24 DIAGNOSIS — E1149 Type 2 diabetes mellitus with other diabetic neurological complication: Secondary | ICD-10-CM

## 2017-04-24 MED ORDER — INSULIN ASPART 100 UNIT/ML FLEXPEN
PEN_INJECTOR | SUBCUTANEOUS | 1 refills | Status: DC
Start: 2017-04-24 — End: 2017-10-23

## 2017-04-24 MED ORDER — INSULIN ASPART 100 UNIT/ML FLEXPEN: PEN_INJECTOR | SUBCUTANEOUS | 1 refills | Status: DC

## 2017-05-15 ENCOUNTER — Telehealth: Payer: Self-pay | Admitting: Internal Medicine

## 2017-05-15 DIAGNOSIS — E1149 Type 2 diabetes mellitus with other diabetic neurological complication: Secondary | ICD-10-CM

## 2017-05-15 MED ORDER — INSULIN GLARGINE 100 UNIT/ML SOLOSTAR PEN: PEN_INJECTOR | SUBCUTANEOUS | 3 refills | Status: DC

## 2017-05-15 NOTE — Telephone Encounter (Signed)
Refill submitted for Lantus to [facility] on [address]

## 2017-05-15 NOTE — Telephone Encounter (Signed)
Patient is needing a refill of LANTUS SOLOSTAR 100 UNIT/ML Solostar Pen [[id number]][[id number]] [facility]   Patient is completely out.

## 2017-06-05 ENCOUNTER — Other Ambulatory Visit: Payer: Self-pay | Admitting: Internal Medicine

## 2017-06-20 ENCOUNTER — Other Ambulatory Visit: Payer: Self-pay | Admitting: Internal Medicine

## 2017-06-20 DIAGNOSIS — E1149 Type 2 diabetes mellitus with other diabetic neurological complication: Secondary | ICD-10-CM

## 2017-07-10 ENCOUNTER — Encounter: Payer: Self-pay | Admitting: Internal Medicine

## 2017-07-10 ENCOUNTER — Ambulatory Visit (INDEPENDENT_AMBULATORY_CARE_PROVIDER_SITE_OTHER): Payer: BLUE CROSS/BLUE SHIELD | Admitting: Internal Medicine

## 2017-07-10 VITALS — BP 160/72 | HR 102 | Resp 16 | Wt 199.0 lb

## 2017-07-10 DIAGNOSIS — Z23 Encounter for immunization: Secondary | ICD-10-CM

## 2017-07-10 DIAGNOSIS — I10 Essential (primary) hypertension: Secondary | ICD-10-CM | POA: Diagnosis not present

## 2017-07-10 DIAGNOSIS — E1149 Type 2 diabetes mellitus with other diabetic neurological complication: Secondary | ICD-10-CM

## 2017-07-10 LAB — POCT GLYCOSYLATED HEMOGLOBIN (HGB A1C): HEMOGLOBIN A1C: 6.6

## 2017-07-10 MED ORDER — INSULIN GLARGINE 100 UNIT/ML SOLOSTAR PEN: PEN_INJECTOR | SUBCUTANEOUS | 11 refills | Status: DC

## 2017-07-10 NOTE — Progress Notes (Signed)
Patient ID: Patrick Hernandez, male   DOB: [birth year], 61 y.o.   MRN: [id number]  HPI: Patrick CromeHyun Kyu Hernandez is a [age] y.o.-year-old male, returning for f/u for DM2, dx 2000, insulin-dependent since 2013, uncontrolled, with complications (+ DR). Last visit 3 months ago. He is here with the [place] interpreter.  DM2: Last hemoglobin A1c was: Lab Results  Component Value Date   HGBA1C 7.0 (H) 03/27/2017   HGBA1C 7.0 11/21/2016   HGBA1C 7.2 08/22/2016  Improvement in diet and starting to take the insulin consistently >> improved HbA1c.  Pt is on a regimen of: - Metformin 1500 mg with dinner - Lantus 38 units in the evening - NovoLog:  15 units before breakfast  6-8 units before lunch  15 units before dinner At this visit, he tells me that he may adjust the doses by 1-2 units if eats a larger meal.  Pt checks his sugars 2-3 times a day >> dinner is still very late (up to 11 PM) so sugars in a.m. are high: - am: 98, 108-165, 180 >> 93, 140-183, 189 >> 117-151, 180 (forgets Lantus) >> 92-151, 160 - 2-3h after b'fast: 73-164 >> 88-179, 195 >> n/c  - before lunch: 80-147, 160 >> 77, 92-136, 159, 171 >> 77, 87-150, 154 >> 86-143, 157 - 2h after lunch: 135-165 >> n/c >> 116-172 >> n/c  - before dinner: 97-135, 145 >> 83-126, 147 >> 121-141 >> 82-135 >> 97-142, 167 - 2h after dinner:  54- 170 >> 84-145 >> n/c  - bedtime:  110-160 >> 87 >> 60 x1, 80-146 >> 123 >> 66-127, 170 - nighttime: n/c No lows. Lowest sugar was 66; he has hypoglycemia awareness at 90.  Highest sugar was 180 >>170  Pt's meals are: - Breakfast: brown rice, beans, soup (tofu, vegetables) - Lunch: same - Dinner: same, salads - Snacks: none  - He does have CKD, last BUN/creatinine:  Lab Results  Component Value Date   BUN 31 (H) 03/27/2017   CREATININE 1.75 (H) 03/27/2017  On Avapro.  Creatinine trend: Lab Results  Component Value Date   CREATININE 1.75 (H) 03/27/2017   CREATININE 1.86 (H) 09/26/2016   CREATININE 1.91 (H)  07/15/2016   CREATININE 1.46 (H) 02/14/2016   CREATININE 1.44 02/07/2015   - last set of lipids: Lab Results  Component Value Date   CHOL 98 02/14/2016   HDL 29.40 (L) 02/14/2016   LDLCALC 56 09/15/2014   LDLDIRECT 39.0 02/14/2016   TRIG 279.0 (H) 02/14/2016   CHOLHDL 3 02/14/2016  On Lipitor. - last eye exam was in 06/2017 (Dr Allena KatzPatel) >> + DR. Had Laser Sx in the past. Now getting IO injections. - He does have numbness and tingling in his feet - 1-3rd toes bilaterally. On ASA 81.  He also has a history of BPH, GERD, HTN.  ROS: Constitutional: no weight gain/no weight loss, no fatigue, no subjective hyperthermia, no subjective hypothermia Eyes: no blurry vision, no xerophthalmia ENT: no sore throat, no nodules palpated in throat, no dysphagia, no odynophagia, no hoarseness Cardiovascular: no CP/no SOB/no palpitations/no leg swelling Respiratory: no cough/no SOB/no wheezing Gastrointestinal: no N/no V/no D/no C/no acid reflux Musculoskeletal: no muscle aches/no joint aches Skin: no rashes, no hair loss Neurological: no tremors/+ numbness/+ tingling/no dizziness  I reviewed pt's medications, allergies, PMH, social hx, family hx, and changes were documented in the history of present illness. Otherwise, unchanged from my initial visit note.  PE: BP (!) 152/73   Pulse (!) 102  Resp 16   Wt 199 lb (90.3 kg)   SpO2 97%   BMI 29.82 kg/m  Body mass index is 29.82 kg/m.   Wt Readings from Last 3 Encounters:  07/10/17 199 lb (90.3 kg)  04/03/17 199 lb (90.3 kg)  03/27/17 200 lb 11.2 oz (91 kg)   Constitutional: overweight, in NAD Eyes: PERRLA, EOMI, no exophthalmos ENT: moist mucous membranes, no thyromegaly, no cervical lymphadenopathy Cardiovascular: Tachycardia, RR, No MRG Respiratory: CTA B Gastrointestinal: abdomen soft, NT, ND, BS+ Musculoskeletal: no deformities, strength intact in all 4 Skin: moist, warm, no rashes Neurological: no tremor with outstretched hands,  DTR normal in all 4  ASSESSMENT: 1. DM2, insulin-dependent, uncontrolled, without complications - ? PAD (decreased pulses in LE) - DR  2. HTN  PLAN:  1. Patient with long-standing, fairly well controlled DM, on basal-bolus insulin regimen, and also Metformin. He usually eats a late dinner and his sugars are higher in the morning. Since last visit, he added 1-2 units to his mealtime NovoLog if he ate a larger meal. His sugars are slightly better during the day, but they are quite low at bedtime. He tells me that he sometimes feel hypoglycemic then and he needs to eat candy. We'll go ahead and decrease his NovoLog with dinner. - He is doing a better job with not forgetting Lantus at night after we moved it earlier in the evening - I suggested to:  Patient Instructions  Please continue: - Metformin 1500 mg with dinner - Lantus 38 units in the evening  Please decrease: - NovoLog:  15 units before breakfast  6 units before lunch  15 >> 12 units before dinner  Please return in 3 months with your sugar log.    - today, HbA1c is 6.6% (better!) - continue checking sugars at different times of the day - check 3x a day, rotating checks - advised for yearly eye exams >> he is UTD - We'll give him the flu shot today-  - Return to clinic in 3 mo with sugar log    2. HTN - his blood pressure was high when he arrived, and even higher at the time of the visit >> 160/72. He mentions that his blood pressure is lower at home, in the 130s over 70s. - He continues on amlodipine 10 mg daily, HCTZ 25 mg daily and irbesartan 150 mg daily  Contact:  Lafonda Mosses (daughter) - in [facility]: [phone]  Carlus Pavlov, MD PhD [facility] Endocrinology

## 2017-07-10 NOTE — Patient Instructions (Addendum)
Please continue: - Metformin 1500 mg with dinner - Lantus 38 units in the evening  Please decrease: - NovoLog:  15 units before breakfast  6 units before lunch  15 >> 12 units before dinner  Please return in 3 months with your sugar log.

## 2017-07-31 ENCOUNTER — Encounter: Payer: Self-pay | Admitting: Family Medicine

## 2017-07-31 ENCOUNTER — Ambulatory Visit (INDEPENDENT_AMBULATORY_CARE_PROVIDER_SITE_OTHER): Payer: BLUE CROSS/BLUE SHIELD | Admitting: Family Medicine

## 2017-07-31 VITALS — BP 120/64 | HR 88 | Temp 98.4°F | Wt 200.0 lb

## 2017-07-31 DIAGNOSIS — K219 Gastro-esophageal reflux disease without esophagitis: Secondary | ICD-10-CM

## 2017-07-31 DIAGNOSIS — N189 Chronic kidney disease, unspecified: Secondary | ICD-10-CM | POA: Diagnosis not present

## 2017-07-31 DIAGNOSIS — E785 Hyperlipidemia, unspecified: Secondary | ICD-10-CM

## 2017-07-31 DIAGNOSIS — E1149 Type 2 diabetes mellitus with other diabetic neurological complication: Secondary | ICD-10-CM | POA: Diagnosis not present

## 2017-07-31 DIAGNOSIS — I10 Essential (primary) hypertension: Secondary | ICD-10-CM | POA: Diagnosis not present

## 2017-07-31 DIAGNOSIS — E1169 Type 2 diabetes mellitus with other specified complication: Secondary | ICD-10-CM | POA: Diagnosis not present

## 2017-07-31 DIAGNOSIS — M659 Synovitis and tenosynovitis, unspecified: Secondary | ICD-10-CM | POA: Diagnosis not present

## 2017-07-31 NOTE — Patient Instructions (Signed)
BEFORE YOU LEAVE: -fasting lab visit in next 1-2 weeks -follow up: 4 months  -We placed a referral for you as discussed to sports medicine regarding the finger. It usually takes about 1-2 weeks to process and schedule this referral. If you have not heard from [place] regarding this appointment in 2 weeks please contact our office.  Advise regular aerobic exercise (at least 150 minutes per week of sweaty exercise) and a healthy diet. Try to eat at least 5-9 servings of vegetables and fruits per day (not corn, potatoes or bananas.) Avoid sweets, red meat, pork, butter, fried foods, fast food, processed food, excessive dairy, eggs and coconut. Replace bad fats with good fats - fish, nuts and seeds, canola oil, olive oil.    We have ordered labs or studies at this visit. It can take up to 1-2 weeks for results and processing. IF results require follow up or explanation, we will call you with instructions. Clinically stable results will be released to your [facility]. If you have not heard from [place] or cannot find your results in [facility] in 2 weeks please contact our office at [phone].  If you are not yet signed up for [facility], please consider signing up.

## 2017-07-31 NOTE — Progress Notes (Signed)
HPI:  Patrick Hernandez is a pleasant 61 y.o. here for follow up. Chronic medical problems summarized below were reviewed for changes. He has a new concern  Of his left ring finger catching and popping at times. This is painful at times. This is been ongoing for 3 months. No known trauma, weakness or numbness. Denies CP, SOB, DOE, treatment intolerance or new symptoms.  HTN, HLD, CKD in the setting of diabetes: -meds: lipitor, norvasc, hctz, irbesartan  DM with renal and neurological complications: -sees Dr. Cruzita Lederer for management -meds: asa, arb, statin, metformin, novolog, lantus  ROS: See pertinent positives and negatives per HPI.  Past Medical History:  Diagnosis Date  . 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 Hypertension   . Palpitations   . Polyneuropathy in diabetes(357.2)   . Type II or unspecified type diabetes mellitus with neurological manifestations, not stated as uncontrolled(250.60)     Past Surgical History:  Procedure Laterality Date  . None      Family History  Problem Relation Age of Onset  . Diabetes Mother   . Diabetes Father   . Heart disease Father        Pacemaker    Social History   Social History  . Marital status: Married    Spouse name: N/A  . Number of children: N/A  . Years of education: N/A   Occupational History  . Self employed    Social History Main Topics  . Smoking status: Never Smoker  . Smokeless tobacco: Never Used  . Alcohol use No  . Drug use: No  . Sexual activity: Not Asked   Other Topics Concern  . 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  acetaminophen-codeine (TYLENOL #3) 300-30 MG tablet, , Disp: , Rfl: 0 .  amLODipine (NORVASC) 10 MG tablet, TAKE 1 TABLET BY MOUTH EVERY DAY, Disp: 30 tablet, Rfl: 5 .  aspirin 81 MG tablet, Take 81 mg by mouth daily., Disp: , Rfl:  .  atorvastatin (LIPITOR) 40 MG tablet, TAKE 1 TABLET (40 MG TOTAL)  BY MOUTH DAILY., Disp: 90 tablet, Rfl: 1 .  BAYER MICROLET LANCETS lancets, Use to test blood sugar 3 times daily as instructed. Dx: E11.40, Disp: 100 each, Rfl: 11 .  BD PEN NEEDLE NANO U/F 32G X 4 MM MISC, USE 4 TIMES A DAY, Disp: 100 each, Rfl: 11 .  Blood Glucose Monitoring Suppl (BAYER CONTOUR NEXT MONITOR) w/Device KIT, Use to test blood sugar daily as instructed. Dx: E11.40, Disp: 1 kit, Rfl: 0 .  CONTOUR NEXT TEST test strip, TEST BLOOD SUGAR 3 TIMES A DAY AS INSTRUCTED, Disp: 100 each, Rfl: 2 .  hydrochlorothiazide (HYDRODIURIL) 25 MG tablet, Take 1 tablet (25 mg total) by mouth daily., Disp: 90 tablet, Rfl: 3 .  insulin aspart (NOVOLOG FLEXPEN) 100 UNIT/ML FlexPen, Inject up to 36 units daily as instructed., Disp: 15 pen, Rfl: 1 .  Insulin Glargine (LANTUS SOLOSTAR) 100 UNIT/ML Solostar Pen, INJECT 0.38 MLS (38 UNITS TOTAL) INTO THE SKIN AT BEDTIME., Disp: 15 pen, Rfl: 11 .  irbesartan (AVAPRO) 150 MG tablet, TAKE ONE TABLET BY MOUTH DAILY, Disp: 90 tablet, Rfl: 2 .  metFORMIN (GLUCOPHAGE) 500 MG tablet, TAKE 3 TABLETS IN EVENING WITH DINNER, Disp: 270 tablet, Rfl: 0 .  pantoprazole (PROTONIX) 20 MG tablet, Take 1 tablet (20 mg total) by mouth daily., Disp: 90 tablet, Rfl: 1 .  tamsulosin (FLOMAX) 0.4 MG CAPS capsule, TAKE ONE CAPSULE BY MOUTH EVERY DAY, Disp: 30 capsule, Rfl: 5  EXAM:  Vitals:   07/31/17 1502  BP: 120/64  Pulse: 88  Temp: 98.4 F (36.9 C)  SpO2: 97%    Body mass index is 29.97 kg/m.  GENERAL: vitals reviewed and listed above, alert, oriented, appears well hydrated and in no acute distress  HEENT: atraumatic, conjunttiva clear, no obvious abnormalities on inspection of external nose and ears  NECK: no obvious masses on inspection  LUNGS: clear to auscultation bilaterally, no wheezes, rales or rhonchi, good air movement  CV: HRRR, no peripheral edema  MS: moves all extremities without noticeable abnormality, dose to palpation over region just proximal to  the fourth MCP  joint region with a small soft nodule in this area  PSYCH: pleasant and cooperative, no obvious depression or anxiety  ASSESSMENT AND PLAN:  Discussed the following assessment and plan:  Diabetes mellitus type 2 with neurological manifestations ([place])  Hyperlipidemia associated with type 2 diabetes mellitus ([place]) - Plan: Lipid panel  Essential hypertension - Plan: Basic metabolic panel, CBC  Gastroesophageal reflux disease, esophagitis presence not specified  Tenosynovitis - Plan: Ambulatory referral to Sports Medicine  Chronic kidney disease, unspecified CKD stage  -labs - due for fasting lipid so he will schedule lab visit -Lifestyle recommendations -Referral to sports medicine for likely tenosynovitis -Patient advised to return or notify a doctor immediately if symptoms worsen or persist or new concerns arise.  Patient Instructions  BEFORE YOU LEAVE: -fasting lab visit in next 1-2 weeks -follow up: 4 months  -We placed a referral for you as discussed to sports medicine regarding the finger. It usually takes about 1-2 weeks to process and schedule this referral. If you have not heard from [place] regarding this appointment in 2 weeks please contact our office.  Advise regular aerobic exercise (at least 150 minutes per week of sweaty exercise) and a healthy diet. Try to eat at least 5-9 servings of vegetables and fruits per day (not corn, potatoes or bananas.) Avoid sweets, red meat, pork, butter, fried foods, fast food, processed food, excessive dairy, eggs and coconut. Replace bad fats with good fats - fish, nuts and seeds, canola oil, olive oil.    We have ordered labs or studies at this visit. It can take up to 1-2 weeks for results and processing. IF results require follow up or explanation, we will call you with instructions. Clinically stable results will be released to your [facility]. If you have not heard from [place] or cannot find your results in [facility] in 2 weeks  please contact our office at [phone].  If you are not yet signed up for [facility], please consider signing up.          Colin Benton R., DO

## 2017-08-03 ENCOUNTER — Other Ambulatory Visit (INDEPENDENT_AMBULATORY_CARE_PROVIDER_SITE_OTHER): Payer: BLUE CROSS/BLUE SHIELD

## 2017-08-03 DIAGNOSIS — I10 Essential (primary) hypertension: Secondary | ICD-10-CM | POA: Diagnosis not present

## 2017-08-03 DIAGNOSIS — E1169 Type 2 diabetes mellitus with other specified complication: Secondary | ICD-10-CM

## 2017-08-03 DIAGNOSIS — E785 Hyperlipidemia, unspecified: Secondary | ICD-10-CM | POA: Diagnosis not present

## 2017-08-03 LAB — CBC
HCT: 38.9 % — ABNORMAL LOW (ref 39.0–52.0)
Hemoglobin: 13.3 g/dL (ref 13.0–17.0)
MCHC: 34.2 g/dL (ref 30.0–36.0)
MCV: 88.7 fl (ref 78.0–100.0)
PLATELETS: 282 10*3/uL (ref 150.0–400.0)
RBC: 4.38 Mil/uL (ref 4.22–5.81)
RDW: 12.6 % (ref 11.5–15.5)
WBC: 7.6 10*3/uL (ref 4.0–10.5)

## 2017-08-03 LAB — LIPID PANEL
CHOL/HDL RATIO: 3
CHOLESTEROL: 104 mg/dL (ref 0–200)
HDL: 32.4 mg/dL — AB (ref >39.00)
LDL CALC: 45 mg/dL (ref 0–99)
NonHDL: 71.33
TRIGLYCERIDES: 131 mg/dL (ref 0.0–149.0)
VLDL: 26.2 mg/dL (ref 0.0–40.0)

## 2017-08-03 LAB — BASIC METABOLIC PANEL
BUN: 26 mg/dL — ABNORMAL HIGH (ref 6–23)
CALCIUM: 9.1 mg/dL (ref 8.4–10.5)
CO2: 27 meq/L (ref 19–32)
CREATININE: 1.59 mg/dL — AB (ref 0.40–1.50)
Chloride: 103 mEq/L (ref 96–112)
GFR: 47.33 mL/min — AB (ref >60.00)
Glucose, Bld: 162 mg/dL — ABNORMAL HIGH (ref 70–99)
Potassium: 5 mEq/L (ref 3.5–5.1)
SODIUM: 140 meq/L (ref 135–145)

## 2017-08-14 ENCOUNTER — Ambulatory Visit (INDEPENDENT_AMBULATORY_CARE_PROVIDER_SITE_OTHER): Payer: BLUE CROSS/BLUE SHIELD | Admitting: Sports Medicine

## 2017-08-14 ENCOUNTER — Ambulatory Visit: Payer: Self-pay

## 2017-08-14 ENCOUNTER — Encounter: Payer: Self-pay | Admitting: Sports Medicine

## 2017-08-14 VITALS — BP 150/72 | HR 97 | Ht 68.5 in | Wt 198.6 lb

## 2017-08-14 DIAGNOSIS — E1149 Type 2 diabetes mellitus with other diabetic neurological complication: Secondary | ICD-10-CM

## 2017-08-14 DIAGNOSIS — M542 Cervicalgia: Secondary | ICD-10-CM | POA: Insufficient documentation

## 2017-08-14 DIAGNOSIS — I10 Essential (primary) hypertension: Secondary | ICD-10-CM

## 2017-08-14 DIAGNOSIS — M65342 Trigger finger, left ring finger: Secondary | ICD-10-CM

## 2017-08-14 DIAGNOSIS — N183 Chronic kidney disease, stage 3 unspecified: Secondary | ICD-10-CM

## 2017-08-14 MED ORDER — PREDNISONE 10 MG PO TABS: 10.0000 mg | ORAL_TABLET | Freq: Every day | ORAL | 0 refills | Status: DC

## 2017-08-14 NOTE — Patient Instructions (Signed)

## 2017-08-14 NOTE — Assessment & Plan Note (Signed)
Poor cervical range of motion with likely underlying cervical spondylosis.  If any lack of improvement with low-dose steroid will need plain film x-rays and consideration of further advanced imaging.  No red flag symptoms

## 2017-08-14 NOTE — Assessment & Plan Note (Signed)
Slightly elevated he is also not a candidate for NSAIDs due to his cardiovascular risk

## 2017-08-14 NOTE — Progress Notes (Signed)
OFFICE VISIT NOTE Patrick Hernandez. [place], [place] [facility] - 61 y.o. male MRN [id number]  Date of birth: 12-14-1955  Visit Date: 08/14/2017  PCP: Lucretia Kern, DO   Referred by: Lucretia Kern, DO  Burlene Arnt, CMA acting as scribe for Dr. Paulla Fore.  SUBJECTIVE:   Chief Complaint  Patient presents with  . New Patient (Initial Visit)    finger pain   HPI: As below and per problem based documentation when appropriate.  Patrick Hernandez is a new patient presenting today for evaluation of left ring finger pain and neck pain.   Pain started about 5-6 months ago.  No known injury or trauma.  The pain is described as tenderness to palpation. Worsened in the morning.  Improves with rest Therapies tried include No OTC meds, no ice or heat.  Other associated symptoms include: middle left finger and into the metacarpals. He is able to bend the 2 fingers but then has a hard time straightening them back out.   Neck pain has been present x 3-4 mos.  No know injury or trauma. He feels pain may be related to how he sleeps.  Pain is described as pressure and tingling. Pain is rated 4/10.  Pain is worse with movement, looking up and down.  Pain improves at rest.  He has not tried any OTC meds for the pain.  No radiation of pain into the shoulders or mid back.     Review of Systems  Constitutional: Negative for chills and fever.  Respiratory: Negative for shortness of breath and wheezing.   Cardiovascular: Negative for chest pain and palpitations.  Musculoskeletal: Positive for joint pain and neck pain.  Neurological: Positive for tingling. Negative for dizziness and headaches.    Otherwise per HPI.  HISTORY & PERTINENT PRIOR DATA:  No specialty comments available. He reports that he has never smoked. He has never used smokeless tobacco.   Recent Labs  11/21/16 1648 03/27/17 1519 07/10/17 1520  HGBA1C 7.0 7.0*  6.6   Allergies reviewed per EMR Prior to Admission medications   Medication Sig Start Date End Date Taking? Authorizing Provider  acetaminophen-codeine (TYLENOL #3) 300-30 MG tablet  11/19/16  Yes [provider]  amLODipine (NORVASC) 10 MG tablet TAKE 1 TABLET BY MOUTH EVERY DAY 03/23/17  Yes Lucretia Kern, DO  aspirin 81 MG tablet Take 81 mg by mouth daily.   Yes [provider]  atorvastatin (LIPITOR) 40 MG tablet TAKE 1 TABLET (40 MG TOTAL) BY MOUTH DAILY. 03/05/17  Yes Lucretia Kern, DO  BAYER MICROLET LANCETS lancets Use to test blood sugar 3 times daily as instructed. Dx: E11.40 01/31/16  Yes Philemon Kingdom, MD  BD PEN NEEDLE NANO U/F 32G X 4 MM MISC USE 4 TIMES A DAY 12/25/16  Yes Colin Benton R, DO  Blood Glucose Monitoring Suppl (BAYER CONTOUR NEXT MONITOR) w/Device KIT Use to test blood sugar daily as instructed. Dx: E11.40 01/31/16  Yes Philemon Kingdom, MD  CONTOUR NEXT TEST test strip TEST BLOOD SUGAR 3 TIMES A DAY AS INSTRUCTED 06/05/17  Yes Philemon Kingdom, MD  hydrochlorothiazide (HYDRODIURIL) 25 MG tablet Take 1 tablet (25 mg total) by mouth daily. 12/26/16  Yes Colin Benton R, DO  insulin aspart (NOVOLOG FLEXPEN) 100 UNIT/ML FlexPen Inject up to 36 units daily as instructed. 04/24/17  Yes Philemon Kingdom, MD  Insulin Glargine (LANTUS SOLOSTAR) 100 UNIT/ML Solostar Pen  INJECT 0.38 MLS (38 UNITS TOTAL) INTO THE SKIN AT BEDTIME. 07/10/17  Yes Philemon Kingdom, MD  irbesartan (AVAPRO) 150 MG tablet TAKE ONE TABLET BY MOUTH DAILY 11/07/16  Yes Colin Benton R, DO  metFORMIN (GLUCOPHAGE) 500 MG tablet TAKE 3 TABLETS IN [place] WITH DINNER 06/22/17  Yes Philemon Kingdom, MD  pantoprazole (PROTONIX) 20 MG tablet Take 1 tablet (20 mg total) by mouth daily. 02/12/17  Yes Colin Benton R, DO  tamsulosin (FLOMAX) 0.4 MG CAPS capsule TAKE ONE CAPSULE BY MOUTH EVERY DAY 03/09/17  Yes Lucretia Kern, DO  predniSONE (DELTASONE) 10 MG tablet Take 1 tablet (10 mg total) by mouth daily  with breakfast. 08/14/17   Gerda Diss, DO   Patient Active Problem List   Diagnosis Date Noted  . Neck pain 08/14/2017  . Trigger ring finger of left hand 08/14/2017  . Hyperlipidemia associated with type 2 diabetes mellitus ([place]) 07/31/2017  . Chronic kidney disease 07/31/2017  . Peripheral edema 12/14/2014  . GERD (gastroesophageal reflux disease) 12/16/2013  . Diabetes mellitus type 2 with neurological manifestations ([place]) 02/02/2013  . Essential hypertension 02/02/2013  . BPH (benign prostatic hyperplasia) 02/02/2013  . Diminished pulses in lower extremity 02/02/2013   Past Medical History:  Diagnosis Date  . 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 Hypertension   . Palpitations   . Polyneuropathy in diabetes(357.2)   . Type II or unspecified type diabetes mellitus with neurological manifestations, not stated as uncontrolled(250.60)    Family History  Problem Relation Age of Onset  . Diabetes Mother   . Diabetes Father   . Heart disease Father        Pacemaker   Past Surgical History:  Procedure Laterality Date  . None     Social History   Occupational History  . Self employed    Social History Main Topics  . Smoking status: Never Smoker  . Smokeless tobacco: Never Used  . Alcohol use No  . Drug use: No  . Sexual activity: Not on file    OBJECTIVE:  VS:  HT:5' 8.5" (174 cm)   WT:198 lb 9.6 oz (90.1 kg)  BMI:29.75    BP:(!) 150/72  HR:97bpm  TEMP: ( )  RESP:97 % EXAM: Findings:  WDWN, NAD, Non-toxic appearing.  Interpretive services used throughout the entirety of the exam and encounter. Alert & appropriately interactive Not depressed or anxious appearing No increased work of breathing. Pupils are equal. EOM intact without nystagmus No clubbing or cyanosis of the extremities appreciated No significant rashes/lesions/ulcerations overlying the examined area. Radial pulses 2+/4.  No significant generalized UE edema. Sensation intact to light touch in  upper extremities.  Neck: He has poor cervical sidebending and rotation especially worse on the right than the left.  Mild pain with Spurling's compression test but this is minimal.  No pain with brachial plexus squeeze or arm squeeze test.  Upper extremity strength is 5 out of 5 although he does have some weakness with grip strength on the left due to the trigger finger.  Left hand: Overall well aligned he does have a slight flexion contracture of the left fourth finger consistent with trigger finger and significant pain over the A1 pulley.  He has triggering that is appreciated.  No focal pain over the third A1 pulley.    RADIOLOGY:  ASSESSMENT & PLAN:     ICD-10-CM   1. Trigger ring finger of left hand M65.342 [place] LIMITED JOINT SPACE STRUCTURES UP LEFT(NO LINKED CHARGES)  2. Neck pain M54.2   3. Essential hypertension I10   4. Diabetes mellitus type 2 with neurological manifestations ([place]) E11.49   5. Stage 3 chronic kidney disease ([facility]) N18.3    ================================================================= Diabetes mellitus type 2 with neurological manifestations Will need to be cautious with low-dose steroid but his A1c has been well controlled recently.  He is not a great candidate for NSAID therapy for his neck due to the elevated creatinine and he should do well with anti-inflammatory effect from low-dose steroids.  Essential hypertension Slightly elevated he is also not a candidate for NSAIDs due to his cardiovascular risk  Neck pain Poor cervical range of motion with likely underlying cervical spondylosis.  If any lack of improvement with low-dose steroid will need plain film x-rays and consideration of further advanced imaging.  No red flag symptoms  Trigger ring finger of left hand Ultrasound-guided injection today.  If any lack of improvement will consider repeat injection versus consideration of further operative options.  PROCEDURE NOTE -  ULTRASOUND GUIDEDINJECTION:  4th Trigger Finger Images were obtained and interpreted by myself, Teresa Coombs, DO  Images have been saved and stored to PACS system. Images obtained on: GE S7 Ultrasound machine  ULTRASOUND FINDINGS: Marked stenosis with lack of movement of the fourth flexor tendon.  Hypertrophy of the A1 pulley  DESCRIPTION OF PROCEDURE:  The patient's clinical condition is marked by substantial pain and/or significant functional disability. Other conservative therapy has not provided relief, is contraindicated, or not appropriate. There is a reasonable likelihood that injection will significantly improve the patient's pain and/or functional impairment. After discussing the risks, benefits and expected outcomes of the injection and all questions were reviewed and answered, the patient wished to undergo the above named procedure. Verbal consent was obtained. The ultrasound was used to identify the target structure and adjacent neurovascular structures. The skin was then prepped in sterile fashion and the target structure was injected under direct visualization using sterile technique as below: PREP: Alcohol, Ethel Chloride APPROACH: Direct inplane, single injection, 25g 1.5" needle INJECTATE: 0.5cc 1% lidocaine, 0.5cc 0.5% marcaine, 0.5cc 56m DepoMedrol ASPIRATE: N/A DRESSING: Band-Aid  Post procedural instructions including recommending icing and warning signs for infection were reviewed. This procedure was well tolerated and there were no complications.   IMPRESSION: Succesful [place]ided Injection    =================================================================  Follow-up: Return in about 4 weeks (around 09/11/2017).   CMA/ATC served as sEducation administratorduring this visit. History, Physical, and Plan performed by medical provider. Documentation and orders reviewed and attested to.      MTeresa Coombs DTanquecitos South AcresSports Medicine Physician

## 2017-08-14 NOTE — Procedures (Signed)
PROCEDURE NOTE -  ULTRASOUND GUIDEDINJECTION: 4th Trigger Finger Images were obtained and interpreted by myself, Gaspar Bidding, DO  Images have been saved and stored to PACS system. Images obtained on: GE S7 Ultrasound machine  ULTRASOUND FINDINGS: Marked stenosis with lack of movement of the fourth flexor tendon.  Hypertrophy of the A1 pulley  DESCRIPTION OF PROCEDURE:  The patient's clinical condition is marked by substantial pain and/or significant functional disability. Other conservative therapy has not provided relief, is contraindicated, or not appropriate. There is a reasonable likelihood that injection will significantly improve the patient's pain and/or functional impairment. After discussing the risks, benefits and expected outcomes of the injection and all questions were reviewed and answered, the patient wished to undergo the above named procedure. Verbal consent was obtained. The ultrasound was used to identify the target structure and adjacent neurovascular structures. The skin was then prepped in sterile fashion and the target structure was injected under direct visualization using sterile technique as below: PREP: Alcohol, Ethel Chloride APPROACH: Direct inplane, single injection, 25g 1.5" needle INJECTATE: 0.5cc 1% lidocaine, 0.5cc 0.5% marcaine, 0.5cc  DepoMedrol ASPIRATE: N/A DRESSING: Band-Aid  Post procedural instructions including recommending icing and warning signs for infection were reviewed. This procedure was well tolerated and there were no complications.   IMPRESSION: Succesful US Guided Injection

## 2017-08-14 NOTE — Assessment & Plan Note (Signed)
Ultrasound-guided injection today.  If any lack of improvement will consider repeat injection versus consideration of further operative options.

## 2017-08-14 NOTE — Assessment & Plan Note (Signed)
Will need to be cautious with low-dose steroid but his A1c has been well controlled recently.  He is not a great candidate for NSAID therapy for his neck due to the elevated creatinine and he should do well with anti-inflammatory effect from low-dose steroids.

## 2017-08-19 ENCOUNTER — Other Ambulatory Visit: Payer: Self-pay | Admitting: Family Medicine

## 2017-08-20 ENCOUNTER — Other Ambulatory Visit: Payer: Self-pay | Admitting: Family Medicine

## 2017-08-31 ENCOUNTER — Other Ambulatory Visit: Payer: Self-pay | Admitting: Family Medicine

## 2017-09-11 ENCOUNTER — Ambulatory Visit: Payer: BLUE CROSS/BLUE SHIELD | Admitting: Sports Medicine

## 2017-09-15 ENCOUNTER — Other Ambulatory Visit: Payer: Self-pay | Admitting: Internal Medicine

## 2017-09-15 DIAGNOSIS — E1149 Type 2 diabetes mellitus with other diabetic neurological complication: Secondary | ICD-10-CM

## 2017-10-13 ENCOUNTER — Other Ambulatory Visit: Payer: Self-pay | Admitting: *Deleted

## 2017-10-13 MED ORDER — HYDROCHLOROTHIAZIDE 25 MG PO TABS: 25.0000 mg | ORAL_TABLET | Freq: Every day | ORAL | 1 refills | Status: DC

## 2017-10-13 NOTE — Telephone Encounter (Signed)
Rx done. 

## 2017-10-20 ENCOUNTER — Other Ambulatory Visit: Payer: Self-pay | Admitting: Internal Medicine

## 2017-10-20 DIAGNOSIS — E1149 Type 2 diabetes mellitus with other diabetic neurological complication: Secondary | ICD-10-CM

## 2017-10-23 ENCOUNTER — Encounter: Payer: Self-pay | Admitting: Internal Medicine

## 2017-10-23 ENCOUNTER — Ambulatory Visit: Payer: BLUE CROSS/BLUE SHIELD | Admitting: Internal Medicine

## 2017-10-23 VITALS — BP 170/80 | HR 104 | Ht 68.5 in | Wt 205.0 lb

## 2017-10-23 DIAGNOSIS — E1169 Type 2 diabetes mellitus with other specified complication: Secondary | ICD-10-CM | POA: Diagnosis not present

## 2017-10-23 DIAGNOSIS — E785 Hyperlipidemia, unspecified: Secondary | ICD-10-CM

## 2017-10-23 DIAGNOSIS — E1149 Type 2 diabetes mellitus with other diabetic neurological complication: Secondary | ICD-10-CM | POA: Diagnosis not present

## 2017-10-23 LAB — POCT GLYCOSYLATED HEMOGLOBIN (HGB A1C): HEMOGLOBIN A1C: 6.6

## 2017-10-23 MED ORDER — INSULIN ASPART 100 UNIT/ML FLEXPEN: PEN_INJECTOR | SUBCUTANEOUS | 11 refills | Status: DC

## 2017-10-23 MED ORDER — INSULIN GLARGINE 100 UNIT/ML SOLOSTAR PEN: PEN_INJECTOR | SUBCUTANEOUS | 11 refills | Status: DC

## 2017-10-23 MED ORDER — METFORMIN HCL 500 MG PO TABS: ORAL_TABLET | ORAL | 3 refills | Status: DC

## 2017-10-23 NOTE — Patient Instructions (Addendum)
Please continue: - Metformin 1500 mg with dinner  Please decrease: - Lantus 34 units in the evening  Please decrease: - NovoLog:  15 units before breakfast  6 units before lunch  12 units before a regular dinner, 15 units for a large dinner  Please return in 3 months with your sugar log.

## 2017-10-23 NOTE — Addendum Note (Signed)
Addended by: Yolande JollyLAWSON, Elleana Stillson on: 10/23/2017 03:55 PM   Modules accepted: Orders

## 2017-10-23 NOTE — Progress Notes (Signed)
Patient ID: Patrick Hernandez, male   DOB: [birth year], 61 y.o.   MRN: [id number]  HPI: Patrick Hernandez is a 61 y.o.-year-old male, returning for f/u for DM2, dx 2000, insulin-dependent since 2013, uncontrolled, with complications (+ DR). Last visit 3.5 months ago.    DM2: Last hemoglobin A1c was: Lab Results  Component Value Date   HGBA1C 6.6 07/10/2017   HGBA1C 7.0 (H) 03/27/2017   HGBA1C 7.0 11/21/2016  Improvement in diet and starting to take the insulin consistently >> improved HbA1c.  Pt is on a regimen of: - Metformin 1500 mg with dinner - Lantus 38 (30 units if lower sugars at bedtime)  in the evening - NovoLog:  15 units before breakfast  6 units before lunch  15  units before dinner He may adjust the doses by 1-2 units if eats a larger meal.  Pt checks his sugars 2-3 times a day: - am: 117-151, 180 >> 92-151, 160 >> 96-155, 161, 170 - 2-3h after b'fast: 73-164 >> 88-179, 195 >> n/c  - before lunch: 77, 87-150, 154 >> 86-143, 157 >> 74-131 - 2h after lunch: 135-165 >> n/c >> 116-172 >> n/c  - before dinner:121-141 >> 82-135 >> 97-142, 167 >> 85-136, 154 - 2h after dinner:  54- 170 >> 84-145 >> n/c  - bedtime:  160 x1, 80-146 >> 123 >> 66-127, 170 >> 57, 67 ((using >12 units Novolog at dinnertime), 117 - nighttime: n/c Lowest sugar was 66 >> 57; he has hypoglycemia awareness at 90.  Highest sugar was 180 >>170 >> 170  Pt's meals are: - Breakfast: brown rice, beans, soup (tofu, vegetables) - Lunch: same - Dinner: same, salads - dinner is usually very late -up to 11 PM - Snacks: none  -He has CKD, last BUN/creatinine:  Lab Results  Component Value Date   BUN 26 (H) 08/03/2017   CREATININE 1.59 (H) 08/03/2017  On Avapro.  Creatinine trend: Lab Results  Component Value Date   CREATININE 1.59 (H) 08/03/2017   CREATININE 1.75 (H) 03/27/2017   CREATININE 1.86 (H) 09/26/2016   CREATININE 1.91 (H) 07/15/2016   CREATININE 1.46 (H) 02/14/2016   -+ HL; last set of  lipids: Lab Results  Component Value Date   CHOL 104 08/03/2017   HDL 32.40 (L) 08/03/2017   LDLCALC 45 08/03/2017   LDLDIRECT 39.0 02/14/2016   TRIG 131.0 08/03/2017   CHOLHDL 3 08/03/2017  On Lipitor - last eye exam was in 06/2017 (Dr. Allena KatzPatel): + DR.  He had laser surgery in the past.  Now getting intraocular injections. - He does have numbness and tingling in his feet - 1-3rd toes bilaterally. On ASA 81.  He also has a history of BPH, GERD, HTN.  ROS: Constitutional: no weight gain/no weight loss, no fatigue, no subjective hyperthermia, no subjective hypothermia Eyes: no blurry vision, no xerophthalmia ENT: no sore throat, no nodules palpated in throat, no dysphagia, no odynophagia, no hoarseness Cardiovascular: no CP/no SOB/no palpitations/no leg swelling Respiratory: no cough/no SOB/no wheezing Gastrointestinal: no N/no V/no D/no C/no acid reflux Musculoskeletal: no muscle aches/no joint aches Skin: no rashes, no hair loss Neurological: no tremors/+ numbness/+ tingling/no dizziness  I reviewed pt's medications, allergies, PMH, social hx, family hx, and changes were documented in the history of present illness. Otherwise, unchanged from my initial visit note.  PE: BP (!) 170/80   Pulse (!) 104   Ht 5' 8.5" (1.74 m)   Wt 205 lb (93 kg)   SpO2 96%  BMI 30.72 kg/m  Body mass index is 30.72 kg/m.   Wt Readings from Last 3 Encounters:  10/23/17 205 lb (93 kg)  08/14/17 198 lb 9.6 oz (90.1 kg)  07/31/17 200 lb (90.7 kg)   Constitutional: overweight, in NAD Eyes: PERRLA, EOMI, no exophthalmos ENT: moist mucous membranes, no thyromegaly, no cervical lymphadenopathy Cardiovascular: tachycardia,RR, No MRG Respiratory: CTA B Gastrointestinal: abdomen soft, NT, ND, BS+ Musculoskeletal: no deformities, strength intact in all 4 Skin: moist, warm, no rashes Neurological: no tremor with outstretched hands, DTR normal in all 4  ASSESSMENT: 1. DM2, insulin-dependent,  uncontrolled, without complications - ? PAD (decreased pulses in LE) - DR  2. HL  PLAN:  1. Patient with long-standing, fairly well-controlled diabetes, on basal-bolus insulin regimen and also metformin.  His sugars in the morning are higher, as he usually eats a late dinner.  At last visit, he increased his NovoLog with dinner but he was getting low blood sugars at bedtime so we backed off on this dose of NovoLog he was also doing a better job not forgetting Lantus as much. - since last visit, he again increased his Novolog dose before dinner >> he again has occas. Lows at bedtime >> again advised to use 12 units with this meal, unless he has a very large dinner - will also decrease his Lantus to further avoid lows at night - I suggested to:  Patient Instructions  Please continue: - Metformin 1500 mg with dinner  Please decrease: - Lantus 34 units in the evening  Please decrease: - NovoLog:  15 units before breakfast  6 units before lunch  12 units before a regular dinner, 15 units for a large dinner  Please return in 3 months with your sugar log.    - today, HbA1c is 6.6% (stable) - continue checking sugars at different times of the day - check 3x a day, rotating checks - advised for yearly eye exams >> he is UTD - up-to-date with flu shot  - Return to clinic in 3 mo with sugar log    2. HL -LDL at goal on latest lipid panel from 07/2017.  He did have a low HDL at that time. -Continues on Lipitor  >> no side effects  Contact:  Lafonda MossesDiana (daughter) - in [facility]: [phone]: [phone]  Carlus Pavlovristina Belinda Schlichting, MD PhD [facility]

## 2017-11-27 ENCOUNTER — Ambulatory Visit: Payer: BLUE CROSS/BLUE SHIELD | Admitting: Family Medicine

## 2017-11-27 ENCOUNTER — Encounter: Payer: Self-pay | Admitting: Family Medicine

## 2017-11-27 VITALS — BP 140/70 | HR 95 | Temp 97.8°F | Wt 205.0 lb

## 2017-11-27 DIAGNOSIS — I10 Essential (primary) hypertension: Secondary | ICD-10-CM

## 2017-11-27 DIAGNOSIS — E1149 Type 2 diabetes mellitus with other diabetic neurological complication: Secondary | ICD-10-CM | POA: Diagnosis not present

## 2017-11-27 DIAGNOSIS — E1159 Type 2 diabetes mellitus with other circulatory complications: Secondary | ICD-10-CM

## 2017-11-27 DIAGNOSIS — L608 Other nail disorders: Secondary | ICD-10-CM | POA: Diagnosis not present

## 2017-11-27 DIAGNOSIS — N183 Chronic kidney disease, stage 3 unspecified: Secondary | ICD-10-CM

## 2017-11-27 DIAGNOSIS — G6289 Other specified polyneuropathies: Secondary | ICD-10-CM | POA: Diagnosis not present

## 2017-11-27 DIAGNOSIS — E1169 Type 2 diabetes mellitus with other specified complication: Secondary | ICD-10-CM

## 2017-11-27 DIAGNOSIS — I152 Hypertension secondary to endocrine disorders: Secondary | ICD-10-CM

## 2017-11-27 DIAGNOSIS — E785 Hyperlipidemia, unspecified: Secondary | ICD-10-CM | POA: Diagnosis not present

## 2017-11-27 NOTE — Progress Notes (Signed)
HPI:  Due for foot exam, eye exam, labs Reports he had his eye exam and sees a retinal specialist every 3 months.  HTN, HLD, CKD in the setting of diabetes: -meds: lipitor, norvasc, hctz, irbesartan -No regular exercise, diet okay  DM with renal and neurological complications: -sees Dr. Cruzita Lederer for management -meds: asa, arb, statin, metformin, novolog, lantus Reports since his blood sugars are better controlled and since he got a new mattress that he thinks into-he notices he has some sweat on him when he wakes up in the morning.  He has not checked his sugars when he has this occur.  But, he wonders if his sugar is going to low.  No other symptoms.  ROS: See pertinent positives and negatives per HPI.  Past Medical History:  Diagnosis Date  . 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 Hypertension   . Palpitations   . Polyneuropathy in diabetes(357.2)   . Type II or unspecified type diabetes mellitus with neurological manifestations, not stated as uncontrolled(250.60)     Past Surgical History:  Procedure Laterality Date  . None      Family History  Problem Relation Age of Onset  . Diabetes Mother   . Diabetes Father   . Heart disease Father        Pacemaker    Social History   Socioeconomic History  . Marital status: Married    Spouse name: None  . Number of children: None  . Years of education: None  . Highest education level: None  Social Needs  . Financial resource strain: None  . Food insecurity - worry: None  . Food insecurity - inability: None  . Transportation needs - medical: None  . Transportation needs - non-medical: None  Occupational History  . Occupation: Self employed  Tobacco Use  . Smoking status: Never Smoker  . Smokeless tobacco: Never Used  Substance and Sexual Activity  . Alcohol use: No  . Drug use: No  . Sexual activity: None  Other Topics Concern  . 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  amLODipine (NORVASC) 10 MG tablet, TAKE 1 TABLET BY MOUTH EVERY DAY, Disp: 30 tablet, Rfl: 5 .  aspirin 81 MG tablet, Take 81 mg by mouth daily., Disp: , Rfl:  .  atorvastatin (LIPITOR) 40 MG tablet, TAKE 1 TABLET (40 MG TOTAL) BY MOUTH DAILY., Disp: 90 tablet, Rfl: 1 .  BAYER MICROLET LANCETS lancets, Use to test blood sugar 3 times daily as instructed. Dx: E11.40, Disp: 100 each, Rfl: 11 .  BD PEN NEEDLE NANO U/F 32G X 4 MM MISC, USE 4 TIMES A DAY, Disp: 100 each, Rfl: 11 .  Blood Glucose Monitoring Suppl (BAYER CONTOUR NEXT MONITOR) w/Device KIT, Use to test blood sugar daily as instructed. Dx: E11.40, Disp: 1 kit, Rfl: 0 .  CONTOUR NEXT TEST test strip, TEST BLOOD SUGAR 3 TIMES A DAY AS INSTRUCTED, Disp: 100 each, Rfl: 2 .  hydrochlorothiazide (HYDRODIURIL) 25 MG tablet, Take 1 tablet (25 mg total) by mouth daily., Disp: 90 tablet, Rfl: 1 .  insulin aspart (NOVOLOG FLEXPEN) 100 UNIT/ML FlexPen, Inject up to 36 units daily as instructed., Disp: 15 pen, Rfl: 11 .  Insulin Glargine (LANTUS SOLOSTAR) 100 UNIT/ML Solostar Pen, INJECT 0.34 MLS (34 UNITS TOTAL) INTO THE SKIN AT BEDTIME., Disp: 15 pen, Rfl: 11 .  irbesartan (AVAPRO) 150 MG tablet, TAKE 1  TABLET BY MOUTH EVERY DAY, Disp: 90 tablet, Rfl: 1 .  metFORMIN (GLUCOPHAGE) 500 MG tablet, TAKE 3 TABLETS IN EVENING WITH DINNER, Disp: 270 tablet, Rfl: 3 .  pantoprazole (PROTONIX) 20 MG tablet, TAKE 1 TABLET (20 MG TOTAL) BY MOUTH DAILY., Disp: 90 tablet, Rfl: 1 .  tamsulosin (FLOMAX) 0.4 MG CAPS capsule, TAKE ONE CAPSULE BY MOUTH EVERY DAY, Disp: 30 capsule, Rfl: 5  EXAM:  Vitals:   11/27/17 1532  BP: 140/70  Pulse: 95  Temp: 97.8 F (36.6 C)  SpO2: 97%    Body mass index is 30.72 kg/m.  GENERAL: vitals reviewed and listed above, alert, oriented, appears well hydrated and in no acute distress  HEENT: atraumatic, conjunttiva clear, no obvious abnormalities on inspection of external nose and  ears  NECK: no obvious masses on inspection  LUNGS: clear to auscultation bilaterally, no wheezes, rales or rhonchi, good air movement  CV: HRRR, no peripheral edema  MS: moves all extremities without noticeable abnormality  PSYCH: pleasant and cooperative, no obvious depression or anxiety  ASSESSMENT AND PLAN:  Discussed the following assessment and plan:  Hypertension associated with diabetes ([place]) - Plan: Basic metabolic panel, CBC  Diabetes mellitus type 2 with neurological manifestations ([place]) - Plan: Ambulatory referral to Podiatry  Hyperlipidemia associated with type 2 diabetes mellitus ([place])  Stage 3 chronic kidney disease (HCC)  Other polyneuropathy - Plan: Ambulatory referral to Podiatry  Toenail deformity - Plan: Ambulatory referral to Podiatry  -I am unsure of his medication compliance, as he is unsure of his medicines today, he thought he was taking prednisone chronically -Advised that he bring all of his medicines that he is taking every day to his next appointment -We discussed different treatment options for his blood pressure and he prefers to try to work on diet and exercise and follow-up closely, advised we will need to add more medicines to control his blood pressure better if it is not improving -Lifestyle recommendations at length -Diabetic foot exam performed, referral to podiatry.  Advised to check feet nightly before bed and frequently and take good care of his feet.  Advised to seek care immediately if he ever has any signs of cracking, infection or wound. -Assistant advised to obtain eye report and update -His swelling is likely secondary to the mattress, he has no cough, weight loss, fevers or malaise and feels fine otherwise.  I did advise that he check his sugar when he feels this to ensure he is not having low blood sugar.  He would check his labs today as well. -Patient advised to return or notify a doctor immediately if symptoms worsen or persist or  new concerns arise.  Patient Instructions  BEFORE YOU LEAVE: -call to have optho fax last diabetic eye exam -labs -follow up: 3 months and bring ALL of your medications you are currently taking every day with you.  You are not taking prednisone. That was given to you for a short course by the sports medicine doctor.  We have ordered labs or studies at this visit. It can take up to 1-2 weeks for results and processing. IF results require follow up or explanation, we will call you with instructions. Clinically stable results will be released to your [facility]. If you have not heard from [place] or cannot find your results in [facility] in 2 weeks please contact our office at [phone].  If you are not yet signed up for [facility], please consider signing up.  -We placed a referral for  you as discussed to the podiatrist. It usually takes about 1-2 weeks to process and schedule this referral. If you have not heard from [place] regarding this appointment in 2 weeks please contact our office.  EAT a healthy low sugar diet with avoidance simple carbs (white rice, etc)  Get daily 30 minutes of aerobic exercise  Check your feet everr day          Colin Benton R., DO

## 2017-11-27 NOTE — Addendum Note (Signed)
Addended by: Bonnye FavaKWEI, Jerome Viglione K on: 11/27/2017 04:21 PM   Modules accepted: Orders

## 2017-11-27 NOTE — Patient Instructions (Addendum)
BEFORE YOU LEAVE: -call to have optho fax last diabetic eye exam -labs -follow up: 3 months and bring ALL of your medications you are currently taking every day with you.  You are not taking prednisone. That was given to you for a short course by the sports medicine doctor.  We have ordered labs or studies at this visit. It can take up to 1-2 weeks for results and processing. IF results require follow up or explanation, we will call you with instructions. Clinically stable results will be released to your [facility]. If you have not heard from us or cannot find your results in [facility] in 2 weeks please contact our office at [phone].  If you are not yet signed up for [facility], please consider signing up.  -We placed a referral for you as discussed to the podiatrist. It usually takes about 1-2 weeks to process and schedule this referral. If you have not heard from us regarding this appointment in 2 weeks please contact our office.  EAT a healthy low sugar diet with avoidance simple carbs (white rice, etc)  Get daily 30 minutes of aerobic exercise  Check your feet everr day

## 2017-11-28 LAB — BASIC METABOLIC PANEL
BUN/Creatinine Ratio: 17 (calc) (ref 6–22)
BUN: 27 mg/dL — ABNORMAL HIGH (ref 7–25)
CHLORIDE: 105 mmol/L (ref 98–110)
CO2: 26 mmol/L (ref 20–32)
CREATININE: 1.56 mg/dL — AB (ref 0.70–1.25)
Calcium: 9 mg/dL (ref 8.6–10.3)
Glucose, Bld: 213 mg/dL — ABNORMAL HIGH (ref 65–99)
POTASSIUM: 5.1 mmol/L (ref 3.5–5.3)
SODIUM: 138 mmol/L (ref 135–146)

## 2017-11-28 LAB — CBC WITH DIFFERENTIAL/PLATELET
BASOS ABS: 63 {cells}/uL (ref 0–200)
BASOS PCT: 0.8 %
EOS ABS: 71 {cells}/uL (ref 15–500)
Eosinophils Relative: 0.9 %
HCT: 37.5 % — ABNORMAL LOW (ref 38.5–50.0)
Hemoglobin: 12.9 g/dL — ABNORMAL LOW (ref 13.2–17.1)
Lymphs Abs: 2094 cells/uL (ref 850–3900)
MCH: 30.1 pg (ref 27.0–33.0)
MCHC: 34.4 g/dL (ref 32.0–36.0)
MCV: 87.4 fL (ref 80.0–100.0)
MPV: 10 fL (ref 7.5–12.5)
Monocytes Relative: 9.1 %
Neutro Abs: 4953 cells/uL (ref 1500–7800)
Neutrophils Relative %: 62.7 %
PLATELETS: 267 10*3/uL (ref 140–400)
RBC: 4.29 10*6/uL (ref 4.20–5.80)
RDW: 12.3 % (ref 11.0–15.0)
TOTAL LYMPHOCYTE: 26.5 %
WBC: 7.9 10*3/uL (ref 3.8–10.8)
WBCMIX: 719 {cells}/uL (ref 200–950)

## 2017-12-23 ENCOUNTER — Other Ambulatory Visit: Payer: Self-pay | Admitting: Internal Medicine

## 2017-12-23 DIAGNOSIS — E1149 Type 2 diabetes mellitus with other diabetic neurological complication: Secondary | ICD-10-CM

## 2018-01-18 ENCOUNTER — Ambulatory Visit: Payer: Self-pay

## 2018-01-18 ENCOUNTER — Ambulatory Visit: Payer: BLUE CROSS/BLUE SHIELD | Admitting: Sports Medicine

## 2018-01-18 ENCOUNTER — Encounter: Payer: Self-pay | Admitting: Sports Medicine

## 2018-01-18 VITALS — BP 150/82 | HR 87 | Ht 68.5 in | Wt 205.4 lb

## 2018-01-18 DIAGNOSIS — M65342 Trigger finger, left ring finger: Secondary | ICD-10-CM

## 2018-01-18 DIAGNOSIS — E1149 Type 2 diabetes mellitus with other diabetic neurological complication: Secondary | ICD-10-CM | POA: Diagnosis not present

## 2018-01-18 NOTE — Progress Notes (Signed)
  OBJECTIVE:  VS:  HT:5' 8.5" (174 cm)   WT:205 lb 6.4 oz (93.2 kg)  BMI:30.77    BP:(!) 150/82  HR:87bpm  TEMP: ( )  RESP:96 %   PHYSICAL EXAM: Constitutional: WDWN, Non-toxic appearing. Psychiatric: Alert & appropriately interactive.  Not depressed or anxious appearing. Respiratory: No increased work of breathing.  Trachea Midline Eyes: Pupils are equal.  EOM intact without nystagmus.  No scleral icterus  NEUROVASCULAR exam: No clubbing or cyanosis appreciated No significant venous stasis changes Capillary Refill: normal, less than 2 seconds   Left hand overall well aligned without significant malformation although his ring finger is slightly hypertrophic at the MCP and he has a 20 degree flexor deficiency.  5 degree extensor lag diffusely.  Marked pain over the A1 pulley.  He has triggering appreciated on exam.  Capillary refill is less than 2 seconds.  Slight dysesthesia reported.  ASSESSMENT & PLAN:   1. Trigger ring finger of left hand   2. Diabetes mellitus type 2 with neurological manifestations (HCC)     Orders & Meds:  Orders Placed This Encounter  Procedures  . US MSK POCT ULTRASOUND   No orders of the defined types were placed in this encounter.    PLAN: Repeat ultrasound-guided injection today.  He does have some underlying osteoarthritis of the MCP at this level and if any lack of improvement further diagnostic evaluation with x-ray and possible MRI can be considered.  Surgical intervention will be recommended if any persistent lack of improvement.  >50% of this 40 minute visit spent in direct patient counseling and/or coordination of care.  Discussion was focused on education regarding the in discussing the pathoetiology and anticipated clinical course of the above condition.  Additionally time was spent using an interpreter line for the entirety of his clinic visit.  No problem-specific Assessment & Plan notes found for this encounter.   Follow-up: Return in  about 6 weeks (around 03/01/2018) for repeat clinical exam.

## 2018-01-18 NOTE — Patient Instructions (Signed)

## 2018-01-18 NOTE — Progress Notes (Signed)
  Veverly FellsMichael D. Delorise Shinerigby, DO  [facility] [place] [phone]  Patrick CromeHyun Kyu [place] - 62 y.o. male MRN [id number]  Date of birth: [birth year]  Visit Date: 01/18/2018  PCP: Terressa KoyanagiKim, Hannah R, DO   Referred by: Terressa KoyanagiKim, Hannah R, DO   Scribe for today's visit: Stevenson ClinchBrandy Coleman, CMA     SUBJECTIVE:  Patrick Hernandez is here for Follow-up (L ring finger trigger finger)  08/14/17: Patrick CromeHyun Kyu Burges is a new patient presenting today for evaluation of left ring finger pain and neck pain.  Pain started about 5-6 months ago.  No known injury or trauma.  The pain is described as tenderness to palpation. Worsened in the morning.  Improves with rest Therapies tried include No OTC meds, no ice or heat.  Other associated symptoms include: middle left finger and into the metacarpals. He is able to bend the 2 fingers but then has a hard time straightening them back out.   01/18/18: Compared to the last office visit, his previously described symptoms improved but have since come back. He noticed trouble bending his fourth finger even after injection but got worse prior to December 2018. He reports that pain is minimal but he has a hard time making a fist. He has trouble bending his fourth finger on the L hand. He has noticed discomfort in the tip of his finger.  Current symptoms are moderate & are nonradiating He has been received steroid injection in the past with some relief.    ROS Denies night time disturbances. Denies fevers, chills, or night sweats. Denies unexplained weight loss. Denies personal history of cancer. Denies changes in bowel or bladder habits. Denies recent unreported falls. Denies new or worsening dyspnea or wheezing. Denies headaches or dizziness.  Denies numbness, tingling or weakness  In the extremities.  Denies dizziness or presyncopal episodes Denies lower extremity edema    HISTORY & PERTINENT PRIOR DATA:  Prior History reviewed and updated per  electronic medical record.  Significant history, findings, studies and interim changes include:  reports that  has never smoked. he has never used smokeless tobacco. Recent Labs    03/27/17 1519 07/10/17 1520 10/23/17 1555  HGBA1C 7.0* 6.6 6.6   No specialty comments available. No problems updated.    Please see additional documentation for Objective, Assessment and Plan sections. Pertinent additional documentation may be included in corresponding procedure notes, imaging studies, problem based documentation and patient instructions. Please see these sections of the encounter for additional information regarding this visit.  CMA/ATC served as Neurosurgeonscribe during this visit. History, Physical, and Plan performed by medical provider. Documentation and orders reviewed and attested to.      Andrena MewsMichael D Azlynn Mitnick, DO    [place] [facility] Physician

## 2018-01-18 NOTE — Procedures (Signed)
PROCEDURE NOTE:  Ultrasound Guided: Injection: left hand. 4th flexor tendon sheath Images were obtained and interpreted by myself, Gaspar BiddingMichael Rigby, DO  Images have been saved and stored to PACS system. Images obtained on: GE S7 Ultrasound machine  ULTRASOUND FINDINGS:  Marked thickening of the A1 pulley with thickening of the flexor tendon.  Longitudinal views of the tendon were obtained that do show there is likely an interstitial split however does appear to be intact and at its distal origin.  There is marked triggering appreciated.  DESCRIPTION OF PROCEDURE:  The patient's clinical condition is marked by substantial pain and/or significant functional disability. Other conservative therapy has not provided relief, is contraindicated, or not appropriate. There is a reasonable likelihood that injection will significantly improve the patient's pain and/or functional impairment.  After discussing the risks, benefits and expected outcomes of the injection and all questions were reviewed and answered, the patient wished to undergo the above named procedure. Verbal consent was obtained.  The ultrasound was used to identify the target structure and adjacent neurovascular structures. The skin was then prepped in sterile fashion and the target structure was injected under direct visualization using sterile technique as below:  PREP: Alcohol, Ethel Chloride, 1cc 1% lidocaine on Insulin Needle APPROACH: direct, single injection, 25g 1.5in.  INJECTATE: 0.5cc: 1% lidocaine, 0.5cc: 0.5% marcaine, 0.5cc: 40mg /mL DepoMedrol   ASPIRATE: None   DRESSING: Band-Aid   Post procedural instructions including recommending icing and warning signs for infection were reviewed.   This procedure was well tolerated and there were no complications.   IMPRESSION: Succesful Ultrasound Guided: Injection

## 2018-01-22 ENCOUNTER — Ambulatory Visit: Payer: BLUE CROSS/BLUE SHIELD | Admitting: Internal Medicine

## 2018-01-22 ENCOUNTER — Encounter: Payer: Self-pay | Admitting: Internal Medicine

## 2018-01-22 VITALS — BP 132/72 | HR 95 | Ht 68.5 in | Wt 203.6 lb

## 2018-01-22 DIAGNOSIS — E1169 Type 2 diabetes mellitus with other specified complication: Secondary | ICD-10-CM | POA: Diagnosis not present

## 2018-01-22 DIAGNOSIS — E785 Hyperlipidemia, unspecified: Secondary | ICD-10-CM

## 2018-01-22 DIAGNOSIS — E1149 Type 2 diabetes mellitus with other diabetic neurological complication: Secondary | ICD-10-CM | POA: Diagnosis not present

## 2018-01-22 LAB — HEMOGLOBIN A1C: Hgb A1c MFr Bld: 6.9 % — ABNORMAL HIGH (ref 4.6–6.5)

## 2018-01-22 MED ORDER — INSULIN GLARGINE 100 UNIT/ML SOLOSTAR PEN: PEN_INJECTOR | SUBCUTANEOUS | 11 refills | Status: DC

## 2018-01-22 NOTE — Patient Instructions (Addendum)
Please continue: - Metformin 1500 mg with dinner - Lantus 36-38 units in the evening - NovoLog:  13-15 units before breakfast  6-8 units before lunch  13-15 units before dinner  Please stop at the lab.  Please come back for a follow-up appointment in 3 months

## 2018-01-22 NOTE — Progress Notes (Signed)
Patient ID: Patrick CromeHyun Kyu Hernandez, male   DOB: 10/21/1956, 62 y.o.   MRN: [id number]  HPI: Patrick Hernandez is a 62 y.o.-year-old male, returning for f/u for DM2, dx 2000, insulin-dependent since 2013, uncontrolled, with complications (+ DR). Last visit 3 mo ago.  Last hemoglobin A1c was: Lab Results  Component Value Date   HGBA1C 6.6 10/23/2017   HGBA1C 6.6 07/10/2017   HGBA1C 7.0 (H) 03/27/2017  Improvement in diet and starting to take the insulin consistently >> improved HbA1c.  Pt is on a regimen of: - Metformin 1500 mg with dinner - Lantus 36-38 units in the evening - NovoLog:  13-15 units before breakfast  6-8 units before lunch  13-15 units before dinner  Pt checks his sugars 2-3x a day: - am:  92-151, 160 >> 96-155, 161, 170 >> 88, 126-161, 165 - 2-3h after b'fast:  88-179, 195 >> n/c  - before lunch: 86-143, 157 >> 74-131 >> 75-134 - 2h after lunch:  116-172 >> n/c  - before dinner: 97-142, 167 >> 85-136, 154 >> 82-128 - 2h after dinner: 54- 170 >> 84-145 >> n/c  - bedtime: 66-127, 170 >> 57, 67 , 117 >> 113, 146 - nighttime: n/c Lowest sugar was 57 >> 75; he has hypoglycemia awareness in the 90s. Highest sugar was 170 >>  170.  Pt's meals are: - Breakfast: brown rice, beans, soup (tofu, vegetables) - Lunch: same - Dinner: same, salads - dinner is usually very late -up to 11 PM - Snacks: none  - + CKD, last BUN/creatinine:  Lab Results  Component Value Date   BUN 27 (H) 11/27/2017   CREATININE 1.56 (H) 11/27/2017  On Avapro.  Creatinine trend: Lab Results  Component Value Date   CREATININE 1.56 (H) 11/27/2017   CREATININE 1.59 (H) 08/03/2017   CREATININE 1.75 (H) 03/27/2017   CREATININE 1.86 (H) 09/26/2016   CREATININE 1.91 (H) 07/15/2016   -+ HL; last set of lipids: Lab Results  Component Value Date   CHOL 104 08/03/2017   HDL 32.40 (L) 08/03/2017   LDLCALC 45 08/03/2017   LDLDIRECT 39.0 02/14/2016   TRIG 131.0 08/03/2017   CHOLHDL 3 08/03/2017  On  Lipitor. - last eye exam was in 06/2017: + DR (Dr. Allena KatzPatel).  He had laser surgery in the past.  Now on IO inj's. - + numbness and tingling in his feet - 1-3rd toes bilaterally. On ASA 81.  He also has a history of BPH, GERD, HTN.  ROS: Constitutional: no weight gain/no weight loss, no fatigue, no subjective hyperthermia, no subjective hypothermia Eyes: no blurry vision, no xerophthalmia ENT: no sore throat, no nodules palpated in throat, no dysphagia, no odynophagia, no hoarseness Cardiovascular: no CP/no SOB/no palpitations/no leg swelling Respiratory: no cough/no SOB/no wheezing Gastrointestinal: no N/no V/no D/no C/no acid reflux Musculoskeletal: no muscle aches/no joint aches Skin: no rashes, no hair loss Neurological: no tremors/+ numbness/+ tingling/no dizziness  I reviewed pt's medications, allergies, PMH, social hx, family hx, and changes were documented in the history of present illness. Otherwise, unchanged from my initial visit note.  PE: There were no vitals taken for this visit. There is no height or weight on file to calculate BMI.   Wt Readings from Last 3 Encounters:  01/18/18 205 lb 6.4 oz (93.2 kg)  11/27/17 205 lb (93 kg)  10/23/17 205 lb (93 kg)   Constitutional: overweight, in NAD Eyes: PERRLA, EOMI, no exophthalmos ENT: moist mucous membranes, no thyromegaly, no cervical lymphadenopathy Cardiovascular: RRR,  No MRG Respiratory: CTA B Gastrointestinal: abdomen soft, NT, ND, BS+ Musculoskeletal: no deformities, strength intact in all 4 Skin: moist, warm, no rashes Neurological: no tremor with outstretched hands, DTR normal in all 4  ASSESSMENT: 1. DM2, insulin-dependent, uncontrolled, without complications - ? PAD (decreased pulses in LE) - DR  2. HL  PLAN:  1. Patient with long standing, now better controlled DM2, on basal-bolus insulin regimen + Metformin. At last visit, his am sugars were higher, as he was eating a late dinner. However, sugars after  dinner were low >> decreased NovoLog with dinner. We also decreased Lantus a little to avoid further lows at night. - at this visit, sugars are mostly at goal, w/o significant lows or hyperglycemic spikes - no change in regimen is needed - I suggested to:  Patient Instructions  Please continue: - Metformin 1500 mg with dinner - Lantus 36-38 units in the evening - NovoLog:  13-15 units before breakfast  6-8 units before lunch  13-15 units before dinner  Please stop at the lab.  Please come back for a follow-up appointment in 3 months   - continue checking sugars at different times of the day - check 3x a day, rotating checks - advised for yearly eye exams >> he is UTD - Return to clinic in 3 mo with sugar log    2. HL - reviewed latest Lipid panel from 07/2017: low HDL; LDL and TG's at goal - continues Lipitor w/o SEs  Contact:  Lafonda Mosses (daughter) - in [facility]: [phone]  Office Visit on 01/22/2018  Component Date Value Ref Range Status  . Hgb A1c MFr Bld 01/22/2018 6.9* 4.6 - 6.5 % Final   Glycemic Control Guidelines for People with Diabetes:Non Diabetic:  <6%Goal of Therapy: <7%Additional Action Suggested:  >8%   HbA1c is slightly higher, but preferable as he does not have low CBGs anymore.   Carlus Pavlov, MD PhD [facility]

## 2018-01-25 ENCOUNTER — Telehealth: Payer: Self-pay

## 2018-01-25 NOTE — Telephone Encounter (Signed)
-----   Message from Carlus Pavlovristina Gherghe, MD sent at 01/22/2018  5:36 PM EDT ----- Tad Mooreasandra, can you please call pt: HbA1c is a little higher, but we do not need to change his regimen as the sugars improved in last 1-2 weeks after he increased his long acting insulin.

## 2018-01-25 NOTE — Telephone Encounter (Signed)
Called pt. No answer. Will try later.  

## 2018-01-28 NOTE — Telephone Encounter (Signed)
Spoke to patient. Gave results. Patient verbalized understanding. 

## 2018-02-04 ENCOUNTER — Other Ambulatory Visit: Payer: Self-pay | Admitting: Family Medicine

## 2018-02-10 ENCOUNTER — Other Ambulatory Visit: Payer: Self-pay | Admitting: Family Medicine

## 2018-02-17 ENCOUNTER — Other Ambulatory Visit: Payer: Self-pay | Admitting: Family Medicine

## 2018-02-19 ENCOUNTER — Other Ambulatory Visit: Payer: Self-pay | Admitting: Family Medicine

## 2018-02-25 ENCOUNTER — Ambulatory Visit: Payer: BLUE CROSS/BLUE SHIELD | Admitting: Family Medicine

## 2018-02-25 ENCOUNTER — Encounter: Payer: Self-pay | Admitting: Family Medicine

## 2018-02-25 VITALS — BP 102/60 | HR 94 | Temp 98.4°F | Ht 68.5 in | Wt 199.2 lb

## 2018-02-25 DIAGNOSIS — E1159 Type 2 diabetes mellitus with other circulatory complications: Secondary | ICD-10-CM

## 2018-02-25 DIAGNOSIS — I152 Hypertension secondary to endocrine disorders: Secondary | ICD-10-CM

## 2018-02-25 DIAGNOSIS — E785 Hyperlipidemia, unspecified: Secondary | ICD-10-CM | POA: Diagnosis not present

## 2018-02-25 DIAGNOSIS — E1149 Type 2 diabetes mellitus with other diabetic neurological complication: Secondary | ICD-10-CM

## 2018-02-25 DIAGNOSIS — E1169 Type 2 diabetes mellitus with other specified complication: Secondary | ICD-10-CM

## 2018-02-25 DIAGNOSIS — I10 Essential (primary) hypertension: Secondary | ICD-10-CM | POA: Diagnosis not present

## 2018-02-25 LAB — BASIC METABOLIC PANEL
BUN: 28 mg/dL — AB (ref 6–23)
CO2: 27 mEq/L (ref 19–32)
CREATININE: 1.86 mg/dL — AB (ref 0.40–1.50)
Calcium: 9 mg/dL (ref 8.4–10.5)
Chloride: 103 mEq/L (ref 96–112)
GFR: 39.42 mL/min — AB (ref >60.00)
Glucose, Bld: 234 mg/dL — ABNORMAL HIGH (ref 70–99)
Potassium: 4.8 mEq/L (ref 3.5–5.1)
Sodium: 136 mEq/L (ref 135–145)

## 2018-02-25 LAB — CBC WITH DIFFERENTIAL/PLATELET
BASOS PCT: 0.5 % (ref 0.0–3.0)
Basophils Absolute: 0 10*3/uL (ref 0.0–0.1)
Eosinophils Absolute: 0 10*3/uL (ref 0.0–0.7)
Eosinophils Relative: 0.4 % (ref 0.0–5.0)
HEMATOCRIT: 37.9 % — AB (ref 39.0–52.0)
Hemoglobin: 13.2 g/dL (ref 13.0–17.0)
LYMPHS PCT: 19.8 % (ref 12.0–46.0)
Lymphs Abs: 1.8 10*3/uL (ref 0.7–4.0)
MCHC: 34.8 g/dL (ref 30.0–36.0)
MCV: 87.7 fl (ref 78.0–100.0)
MONOS PCT: 7.2 % (ref 3.0–12.0)
Monocytes Absolute: 0.7 10*3/uL (ref 0.1–1.0)
NEUTROS ABS: 6.6 10*3/uL (ref 1.4–7.7)
Neutrophils Relative %: 72.1 % (ref 43.0–77.0)
PLATELETS: 275 10*3/uL (ref 150.0–400.0)
RBC: 4.32 Mil/uL (ref 4.22–5.81)
RDW: 12.5 % (ref 11.5–15.5)
WBC: 9.2 10*3/uL (ref 4.0–10.5)

## 2018-02-25 NOTE — Patient Instructions (Addendum)
BEFORE YOU LEAVE: -check on status referral to podiatry last visit and update pt/provide number if needed -update eye exam -labs -follow up: 90-100 days for CPE  We have ordered labs or studies at this visit. It can take up to 1-2 weeks for results and processing. IF results require follow up or explanation, we will call you with instructions. Clinically stable results will be released to your [facility]. If you have not heard from us or cannot find your results in [facility] in 2 weeks please contact our office at [phone].  If you are not yet signed up for [facility], please consider signing up.   We recommend the following healthy lifestyle for LIFE: 1) Small portions. But, make sure to get regular (at least 3 per day), healthy meals and small healthy snacks if needed.  2) Eat a healthy clean diet.   TRY TO EAT: -at least 5-7 servings of low sugar, colorful, and nutrient rich vegetables per day (not corn, potatoes or bananas.) -berries are the best choice if you wish to eat fruit (only eat small amounts if trying to reduce weight)  -lean meets (fish, white meat of chicken or [place]urkey) -vegan proteins for some meals - beans or tofu, whole grains, nuts and seeds -Replace bad fats with good fats - good fats include: fish, nuts and seeds, canola oil, olive oil -small amounts of low fat or non fat dairy -small amounts of100 % whole grains - check the lables -drink plenty of water  AVOID: -SUGAR, sweets, anything with added sugar, corn syrup or sweeteners - must read labels as even foods advertised as "healthy" often are loaded with sugar -if you must have a sweetener, small amounts of stevia may be best -sweetened beverages and artificially sweetened beverages -simple starches (rice, bread, potatoes, pasta, chips, etc - small amounts of 100% whole grains are ok) -red meat, pork, butter -fried foods, fast food, processed food, excessive dairy, eggs and coconut.  3)Get at least 150 minutes of  sweaty aerobic exercise per week.  4)Reduce stress - consider counseling, meditation and relaxation to balance other aspects of your life.

## 2018-02-25 NOTE — Addendum Note (Signed)
Addended by: Bonnye FavaKWEI, Montrice Gracey K on: 02/25/2018 03:20 PM   Modules accepted: Orders

## 2018-02-25 NOTE — Progress Notes (Signed)
HPI:  Using dictation device. Unfortunately this device frequently misinterprets words/phrases.  Patrick Hernandez is a pleasant 62 y.o. here for follow up. Chronic medical problems summarized below were reviewed for changes and stability and were updated as needed below. These issues and their treatment remain stable for the most part. Reports doing well for the most part. He was referred to podiatry at his last visit, but he thinks he missed the phone call to schedule. Requests re-referral. Reports exercising rarely. Diet ok, unchanged. Denies CP, SOB, DOE, treatment intolerance or new symptoms. LABS: cbc (mild anemia last check), bmp -obtain and abstract eye exam - done 02/2018 per his report  HTN, HLD, CKD in the setting of diabetes: -meds: lipitor, norvasc, hctz, irbesartan -No regular exercise, diet okay  DM with renal and neurological complications: -sees Dr. Cruzita Lederer for management -Dr. Posey Pronto for eye exams -meds: asa, arb, statin, metformin, novolog, lantus   ROS: See pertinent positives and negatives per HPI.  Past Medical History:  Diagnosis Date  . 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 Hypertension   . Palpitations   . Polyneuropathy in diabetes(357.2)   . Type II or unspecified type diabetes mellitus with neurological manifestations, not stated as uncontrolled(250.60)     Past Surgical History:  Procedure Laterality Date  . None      Family History  Problem Relation Age of Onset  . Diabetes Mother   . Diabetes Father   . Heart disease Father        Pacemaker    SOCIAL HX: see hpi   Current Outpatient Medications:  .  amLODipine (NORVASC) 10 MG tablet, TAKE 1 TABLET BY MOUTH EVERY DAY, Disp: 30 tablet, Rfl: 5 .  aspirin 81 MG tablet, Take 81 mg by mouth daily., Disp: , Rfl:  .  atorvastatin (LIPITOR) 40 MG tablet, TAKE 1 TABLET (40 MG TOTAL) BY MOUTH DAILY., Disp: 90 tablet, Rfl: 1 .  BAYER MICROLET LANCETS lancets, Use to test blood sugar 3 times daily as  instructed. Dx: E11.40, Disp: 100 each, Rfl: 11 .  BD PEN NEEDLE NANO U/F 32G X 4 MM MISC, USE 4 TIMES A DAY, Disp: 100 each, Rfl: 11 .  Blood Glucose Monitoring Suppl (BAYER CONTOUR NEXT MONITOR) w/Device KIT, Use to test blood sugar daily as instructed. Dx: E11.40, Disp: 1 kit, Rfl: 0 .  CONTOUR NEXT TEST test strip, TEST BLOOD SUGAR 3 TIMES A DAY AS INSTRUCTED, Disp: 100 each, Rfl: 2 .  hydrochlorothiazide (HYDRODIURIL) 25 MG tablet, Take 1 tablet (25 mg total) by mouth daily., Disp: 90 tablet, Rfl: 1 .  Insulin Glargine (LANTUS SOLOSTAR) 100 UNIT/ML Solostar Pen, INJECT 36-38 UNITS TOTAL INTO THE SKIN AT BEDTIME., Disp: 15 pen, Rfl: 11 .  irbesartan (AVAPRO) 150 MG tablet, TAKE 1 TABLET BY MOUTH EVERY DAY, Disp: 90 tablet, Rfl: 1 .  metFORMIN (GLUCOPHAGE) 500 MG tablet, TAKE 3 TABLETS IN EVENING WITH DINNER, Disp: 270 tablet, Rfl: 3 .  NOVOLOG FLEXPEN 100 UNIT/ML FlexPen, INJECT UP TO 36 UNITS DAILY AS INSTRUCTED., Disp: 45 pen, Rfl: 1 .  pantoprazole (PROTONIX) 20 MG tablet, TAKE 1 TABLET (20 MG TOTAL) BY MOUTH DAILY., Disp: 90 tablet, Rfl: 1 .  tamsulosin (FLOMAX) 0.4 MG CAPS capsule, TAKE ONE CAPSULE BY MOUTH EVERY DAY, Disp: 30 capsule, Rfl: 5  EXAM:  Vitals:   02/25/18 1449  BP: 102/60  Pulse: 94  Temp: 98.4 F (36.9 C)    Body mass index is 29.85 kg/m.  GENERAL: vitals reviewed  and listed above, alert, oriented, appears well hydrated and in no acute distress  HEENT: atraumatic, conjunttiva clear, no obvious abnormalities on inspection of external nose and ears  NECK: no obvious masses on inspection  LUNGS: clear to auscultation bilaterally, no wheezes, rales or rhonchi, good air movement  CV: HRRR, tr ankle edema  MS: moves all extremities without noticeable abnormality  PSYCH: pleasant and cooperative, no obvious depression or anxiety  ASSESSMENT AND PLAN:  Discussed the following assessment and plan:  Diabetes mellitus type 2 with neurological manifestations  (HCC)  Hyperlipidemia associated with type 2 diabetes mellitus ([place])  Hypertension associated with diabetes ([place])  Gastroesophageal reflux disease, esophagitis presence not specified  -labs to recheck anemia -lifestyle recs -advised assistant to check on podiatry referral and assist pt -CPE in 3-4 months -advised assistant to obtain and update diabetic eye exam  Patient Instructions  BEFORE YOU LEAVE: -check on status referral to podiatry last visit and update pt/provide number if needed -update eye exam -labs -follow up: 90-100 days  We have ordered labs or studies at this visit. It can take up to 1-2 weeks for results and processing. IF results require follow up or explanation, we will call you with instructions. Clinically stable results will be released to your [facility]. If you have not heard from [place] or cannot find your results in [facility] in 2 weeks please contact our office at [phone].  If you are not yet signed up for [facility], please consider signing up.   We recommend the following healthy lifestyle for LIFE: 1) Small portions. But, make sure to get regular (at least 3 per day), healthy meals and small healthy snacks if needed.  2) Eat a healthy clean diet.   TRY TO EAT: -at least 5-7 servings of low sugar, colorful, and nutrient rich vegetables per day (not corn, potatoes or bananas.) -berries are the best choice if you wish to eat fruit (only eat small amounts if trying to reduce weight)  -lean meets (fish, white meat of chicken or [place]) -vegan proteins for some meals - beans or tofu, whole grains, nuts and seeds -Replace bad fats with good fats - good fats include: fish, nuts and seeds, canola oil, olive oil -small amounts of low fat or non fat dairy -small amounts of100 % whole grains - check the lables -drink plenty of water  AVOID: -SUGAR, sweets, anything with added sugar, corn syrup or sweeteners - must read labels as even foods advertised as "healthy"  often are loaded with sugar -if you must have a sweetener, small amounts of stevia may be best -sweetened beverages and artificially sweetened beverages -simple starches (rice, bread, potatoes, pasta, chips, etc - small amounts of 100% whole grains are ok) -red meat, pork, butter -fried foods, fast food, processed food, excessive dairy, eggs and coconut.  3)Get at least 150 minutes of sweaty aerobic exercise per week.  4)Reduce stress - consider counseling, meditation and relaxation to balance other aspects of your life.          Lucretia Kern, DO

## 2018-03-01 ENCOUNTER — Ambulatory Visit: Payer: BLUE CROSS/BLUE SHIELD | Admitting: Sports Medicine

## 2018-03-09 ENCOUNTER — Encounter: Payer: Self-pay | Admitting: Sports Medicine

## 2018-03-09 ENCOUNTER — Ambulatory Visit: Payer: BLUE CROSS/BLUE SHIELD | Admitting: Sports Medicine

## 2018-03-09 VITALS — BP 122/68 | HR 93 | Ht 68.5 in | Wt 201.2 lb

## 2018-03-09 DIAGNOSIS — M65342 Trigger finger, left ring finger: Secondary | ICD-10-CM | POA: Diagnosis not present

## 2018-03-09 MED ORDER — DICLOFENAC SODIUM 2 % TD SOLN: 1.0000 "application " | Freq: Two times a day (BID) | TRANSDERMAL | 0 refills | Status: AC

## 2018-03-09 MED ORDER — DICLOFENAC SODIUM 2 % TD SOLN: 1.0000 "application " | Freq: Two times a day (BID) | TRANSDERMAL | 2 refills | Status: AC

## 2018-03-09 NOTE — Patient Instructions (Signed)
[name] instructions for Duexis, Pennsaid and Vimovo:  Your prescription will be filled through a mail order pharmacy.  It is typically [name] but may vary depending on where you live.  You will receive a phone call from them which will typically come from a [phone] phone number.  You must speak directly to them to have this medication filled.  When the pharmacy calls, they will need your mailing address (for overnight shipment of the medication) andy they will need payment information if you have a copay (typically no more than $10). If you have not heard from them 2-3 days after your appointment with Dr. Rigby, contact us at the office ([phone]) or through MyChart so we can reach back out to the pharmacy.  

## 2018-03-09 NOTE — Progress Notes (Signed)
Patrick Hernandez. [facility] [phone]  Patrick Hernandez - 62 y.o. male MRN [id number]  Date of birth: 09-25-1956  Visit Date: 03/09/2018  PCP: Patrick Koyanagi, DO   Referred by: Patrick Koyanagi, DO  Scribe for today's visit: Patrick Hernandez, LAT, ATC     SUBJECTIVE:  Patrick Hernandez is here for Follow-up (L ring finger trigger finger) .   08/14/17: Patrick Hernandez is a new patient presenting today for evaluation of left ring finger pain and neck pain.  Pain started about 5-6 months ago.  No known injury or trauma.  The pain is described as tenderness to palpation. Worsened in the morning.  Improves with rest Therapies tried include No OTC meds, no ice or heat.  Other associated symptoms include: middle left finger and into the metacarpals. He is able to bend the 2 fingers but then has a hard time straightening them back out.   01/18/18: Compared to the last office visit, his previously described symptoms improved but have since come back. He noticed trouble bending his fourth finger even after injection but got worse prior to December 2018. He reports that pain is minimal but he has a hard time making a fist. He has trouble bending his fourth finger on the L hand. He has noticed discomfort in the tip of his finger.  Current symptoms are moderate & are nonradiating He has been received steroid injection in the past with some relief.   03/09/18: Compared to the last office visit, his previously described L ring finger symptoms are improving with no pain noted and improved L ring finger ROM. Pt states that he currently has no pain in his L ring finger.  He states that he is able to make a fist w/o difficulty but notes that his L ring finger clicks/pops in the morning. He has had an injection in the past w/ good results.  ROS Denies night time disturbances. Denies fevers, chills, or night sweats. Denies unexplained weight loss. Denies  personal history of cancer. Denies changes in bowel or bladder habits. Denies recent unreported falls. Denies new or worsening dyspnea or wheezing. Denies headaches or dizziness.  Reports numbness, tingling or weakness  In the extremities - yes in the B LEs due to DM. Denies dizziness or presyncopal episodes Denies lower extremity edema    HISTORY & PERTINENT PRIOR DATA:  Prior History reviewed and updated per electronic medical record.  Significant/pertinent history, findings, studies include:  reports that he has never smoked. He has never used smokeless tobacco. Recent Labs    07/10/17 1520 10/23/17 1555 01/22/18 1606  HGBA1C 6.6 6.6 6.9*   No specialty comments available. No problems updated.  OBJECTIVE:  VS:  HT:5' 8.5" (174 cm)   WT:201 lb 3.2 oz (91.3 kg)  BMI:30.14    BP:122/68  HR:93bpm  TEMP: ( )  RESP:94 %   PHYSICAL EXAM: Constitutional: WDWN, Non-toxic appearing. Psychiatric: Alert & appropriately interactive.  Not depressed or anxious appearing. Respiratory: No increased work of breathing.  Trachea Midline Eyes: Pupils are equal.  EOM intact without nystagmus.  No scleral icterus  Vascular Exam: warm to touch no edema  upper extremity neuro exam: unremarkable  MSK Exam: Hands overall well aligned without significant deformity.  He has good flexion extension.  Still small prominence of the A1 pulley but this is minimal.  No appreciable persistent locking and no pain with this.   ASSESSMENT &  PLAN:   1. Trigger ring finger of left hand     PLAN: Is doing quite well.  He will plan to follow-up with [place] if any persistent symptoms and we can consider reinjection.  Topical Pennsaid to be used as needed for intermittent use but if only mild or persistent minimal symptoms no further intervention indicated at this time.  Follow-up: Return if symptoms worsen or fail to improve.      Please see additional documentation for Objective, Assessment and Plan  sections. Pertinent additional documentation may be included in corresponding procedure notes, imaging studies, problem based documentation and patient instructions. Please see these sections of the encounter for additional information regarding this visit.  CMA/ATC served as Neurosurgeon during this visit. History, Physical, and Plan performed by medical provider. Documentation and orders reviewed and attested to.      Andrena Mews, DO    [facility]

## 2018-03-15 ENCOUNTER — Telehealth: Payer: Self-pay | Admitting: Sports Medicine

## 2018-03-15 NOTE — Telephone Encounter (Signed)
Copied from CRM #[id number]. Topic: [id number] - See Telephone Encounter >> Mar 15, 2018  3:44 PM Terisa Starr wrote: CRM for notification. See Telephone encounter for: 03/15/18.  Eulas Schweitzer (daughter) called and said that Dr Berline Chough sent a prescription to the mail in order for Diclofenac Sodium (PENNSAID) 2 % SOL. He told her that they would be calling her and that's when they would fill it. She said she has not heard from anyone unless she just missed it. She is going to call them as well but just wanted to let Dr Berline Chough know  Call back is [phone]

## 2018-03-15 NOTE — Telephone Encounter (Signed)
Called Lafonda Mosses and left VM to call the office.

## 2018-03-15 NOTE — Telephone Encounter (Signed)
See note

## 2018-03-16 ENCOUNTER — Ambulatory Visit: Payer: BLUE CROSS/BLUE SHIELD | Admitting: Podiatry

## 2018-03-16 DIAGNOSIS — M79674 Pain in right toe(s): Secondary | ICD-10-CM

## 2018-03-16 DIAGNOSIS — E119 Type 2 diabetes mellitus without complications: Secondary | ICD-10-CM

## 2018-03-16 DIAGNOSIS — B351 Tinea unguium: Secondary | ICD-10-CM | POA: Diagnosis not present

## 2018-03-16 DIAGNOSIS — I739 Peripheral vascular disease, unspecified: Secondary | ICD-10-CM | POA: Diagnosis not present

## 2018-03-16 DIAGNOSIS — E1149 Type 2 diabetes mellitus with other diabetic neurological complication: Secondary | ICD-10-CM | POA: Diagnosis not present

## 2018-03-16 DIAGNOSIS — M79675 Pain in left toe(s): Secondary | ICD-10-CM | POA: Diagnosis not present

## 2018-03-16 NOTE — Progress Notes (Signed)
[id number]  

## 2018-03-17 NOTE — Progress Notes (Signed)
Subjective:   Patient ID: Patrick Hernandez, male   DOB: 62 y.o.   MRN: [id number]   HPI 62 year old male presents the office today for diabetic foot exam.  He also states he has some cold sensations to his feet at times.  He also notes occasional tingling to his feet but it does not wake him up at night.  It does not cause any balance issues.  He does try to trim his toenails himself however very thick and difficult for him to do so.  Denies any redness or drainage or any swelling.  As the toenails but it may cause irritation inside shoes.  He has no other concerns.   Review of Systems  All other systems reviewed and are negative.  Past Medical History:  Diagnosis Date  . 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 Hypertension   . Palpitations   . Polyneuropathy in diabetes(357.2)   . Type II or unspecified type diabetes mellitus with neurological manifestations, not stated as uncontrolled(250.60)     Past Surgical History:  Procedure Laterality Date  . None       Current Outpatient Medications:  .  amLODipine (NORVASC) 10 MG tablet, TAKE 1 TABLET BY MOUTH EVERY DAY, Disp: 30 tablet, Rfl: 5 .  aspirin 81 MG tablet, Take 81 mg by mouth daily., Disp: , Rfl:  .  atorvastatin (LIPITOR) 40 MG tablet, TAKE 1 TABLET (40 MG TOTAL) BY MOUTH DAILY., Disp: 90 tablet, Rfl: 1 .  BAYER MICROLET LANCETS lancets, Use to test blood sugar 3 times daily as instructed. Dx: E11.40, Disp: 100 each, Rfl: 11 .  BD PEN NEEDLE NANO U/F 32G X 4 MM MISC, USE 4 TIMES A DAY, Disp: 100 each, Rfl: 11 .  Blood Glucose Monitoring Suppl (BAYER CONTOUR NEXT MONITOR) w/Device KIT, Use to test blood sugar daily as instructed. Dx: E11.40, Disp: 1 kit, Rfl: 0 .  CONTOUR NEXT TEST test strip, TEST BLOOD SUGAR 3 TIMES A DAY AS INSTRUCTED, Disp: 100 each, Rfl: 2 .  Diclofenac Sodium (PENNSAID) 2 % SOLN, Place 1 application onto the skin 2 (two) times daily for 14 days. [place] language, Disp: 112 g, Rfl: 2 .  hydrochlorothiazide  (HYDRODIURIL) 25 MG tablet, Take 1 tablet (25 mg total) by mouth daily., Disp: 90 tablet, Rfl: 1 .  Insulin Glargine (LANTUS SOLOSTAR) 100 UNIT/ML Solostar Pen, INJECT 36-38 UNITS TOTAL INTO THE SKIN AT BEDTIME., Disp: 15 pen, Rfl: 11 .  irbesartan (AVAPRO) 150 MG tablet, TAKE 1 TABLET BY MOUTH EVERY DAY, Disp: 90 tablet, Rfl: 1 .  metFORMIN (GLUCOPHAGE) 500 MG tablet, TAKE 3 TABLETS IN EVENING WITH DINNER, Disp: 270 tablet, Rfl: 3 .  NOVOLOG FLEXPEN 100 UNIT/ML FlexPen, INJECT UP TO 36 UNITS DAILY AS INSTRUCTED., Disp: 45 pen, Rfl: 1 .  pantoprazole (PROTONIX) 20 MG tablet, TAKE 1 TABLET (20 MG TOTAL) BY MOUTH DAILY., Disp: 90 tablet, Rfl: 1 .  tamsulosin (FLOMAX) 0.4 MG CAPS capsule, TAKE ONE CAPSULE BY MOUTH EVERY DAY, Disp: 30 capsule, Rfl: 5  No Known Allergies  Social History   Socioeconomic History  . Marital status: Married    Spouse name: Not on file  . Number of children: Not on file  . Years of education: Not on file  . Highest education level: Not on file  Occupational History  . Occupation: Self employed  Social Needs  . Financial resource strain: Not on file  . Food insecurity:    Worry: Not on file  Inability: Not on file  . Transportation needs:    Medical: Not on file    Non-medical: Not on file  Tobacco Use  . Smoking status: Never Smoker  . Smokeless tobacco: Never Used  Substance and Sexual Activity  . Alcohol use: No  . Drug use: No  . Sexual activity: Not on file  Lifestyle  . Physical activity:    Days per week: Not on file    Minutes per session: Not on file  . Stress: Not on file  Relationships  . Social connections:    Talks on phone: Not on file    Gets together: Not on file    Attends religious service: Not on file    Active member of club or organization: Not on file    Attends meetings of clubs or organizations: Not on file    Relationship status: Not on file  . Intimate partner violence:    Fear of current or ex partner: Not on file     Emotionally abused: Not on file    Physically abused: Not on file    Forced sexual activity: Not on file  Other Topics Concern  . Not on file  Social History Narrative   Married   Self employed - dry cleaning business        Objective:  Physical Exam  General: AAO x3, NAD  Dermatological: Nails are hypertrophic, dystrophic, discolored, elongated x10.  There is no surrounding redness or drainage.  Specifically bilateral hallux toenails are significantly hypertrophic and dystrophic.  There is subjective tenderness nails 1-5 bilaterally.  Vascular: DP pulses 2/4, PT pulses 1/4, CRT less than 3 seconds.  Neruologic: Sensation intact with Derrel Nip monofilament, decreased vibratory sensation.  Musculoskeletal: No gross boney pedal deformities bilateral. No pain, crepitus, or limitation noted with foot and ankle range of motion bilateral. Muscular strength 5/5 in all groups tested bilateral.  Gait: Unassisted, Nonantalgic.     Assessment:   62 year old male presents for diabetic foot exam with likely early neuropathy symptoms, symptomatic onychomycosis     Plan:  -Treatment options discussed including all alternatives, risks, and complications -Etiology of symptoms were discussed -Nails were debrided x10 without any complications or bleeding -I did an ABI in the office which was normal.  The left 1.22 and the right was also 1.22.  I do think some of this feeling that he is getting his early neuropathy symptoms we will continue to monitor this.  We discussed possibly gabapentin or other medications if needed to hold off on that for now.  Discussed importance of daily foot inspection. -RTC 3 months or sooner if needed.  Trula Slade DPM

## 2018-03-18 NOTE — Telephone Encounter (Signed)
Called Patrick Hernandez's pharmacy and did verbal order for Pennsaid as they had not yet received anything from [facility]. Patrick Hernandez's states that pt should receive the medication by Monday, May 13th. Then called pt's daughter r.e. Pennsaid rx and LM for her to call our direct line at [phone].  Asked her to tell us if it's ok to leave a detailed message regarding Pennsaid info.

## 2018-03-20 ENCOUNTER — Encounter: Payer: Self-pay | Admitting: Sports Medicine

## 2018-03-29 LAB — HM DIABETES EYE EXAM

## 2018-04-14 NOTE — Telephone Encounter (Signed)
[facility] calling back needing the diagnosis code for the pennsaid and what other medications have been tried. CB# [phone].

## 2018-04-14 NOTE — Telephone Encounter (Signed)
Returned call to Patrick Hernandez's and gave them dx code for the pt and informed them that the only prior medication attempted was a steroid injection.  They will submit the info and call back if they need any additional info.

## 2018-04-23 ENCOUNTER — Telehealth: Payer: Self-pay | Admitting: Family Medicine

## 2018-04-23 NOTE — Telephone Encounter (Signed)
Noted  

## 2018-04-23 NOTE — Telephone Encounter (Signed)
Copied from CRM [phone]. Topic: Quick Communication - See Telephone Encounter >> Apr 23, 2018 10:26 AM Oneal GroutSebastian, Jennifer S wrote: CRM for notification. See Telephone encounter for: 04/23/18. Per [employer] insurance denied [place] on pennsaid solution by Dr Berline Choughigby

## 2018-04-23 NOTE — Telephone Encounter (Signed)
See note

## 2018-05-27 ENCOUNTER — Encounter: Payer: Self-pay | Admitting: Family Medicine

## 2018-05-27 ENCOUNTER — Ambulatory Visit: Payer: BLUE CROSS/BLUE SHIELD | Admitting: Family Medicine

## 2018-05-27 VITALS — BP 122/62 | HR 66 | Temp 97.6°F | Ht 68.5 in | Wt 202.8 lb

## 2018-05-27 DIAGNOSIS — E1149 Type 2 diabetes mellitus with other diabetic neurological complication: Secondary | ICD-10-CM

## 2018-05-27 DIAGNOSIS — E785 Hyperlipidemia, unspecified: Secondary | ICD-10-CM

## 2018-05-27 DIAGNOSIS — E1169 Type 2 diabetes mellitus with other specified complication: Secondary | ICD-10-CM

## 2018-05-27 DIAGNOSIS — N183 Chronic kidney disease, stage 3 unspecified: Secondary | ICD-10-CM

## 2018-05-27 DIAGNOSIS — I10 Essential (primary) hypertension: Secondary | ICD-10-CM

## 2018-05-27 DIAGNOSIS — E1159 Type 2 diabetes mellitus with other circulatory complications: Secondary | ICD-10-CM

## 2018-05-27 DIAGNOSIS — I152 Hypertension secondary to endocrine disorders: Secondary | ICD-10-CM

## 2018-05-27 NOTE — Patient Instructions (Signed)
BEFORE YOU LEAVE: Patrick Hernandez, Obtain eye report from Dr. Allena KatzPatel and abstract -labs -follow up: 3-4 months  Try stopping aspirin  Take temperature if cold and let us know if fever. Let us know if any fever, if happens on regular basis or persistent.  We have ordered labs or studies at this visit. It can take up to 1-2 weeks for results and processing. IF results require follow up or explanation, we will call you with instructions. Clinically stable results will be released to your [facility]. If you have not heard from us or cannot find your results in [facility] in 2 weeks please contact our office at [phone].  If you are not yet signed up for [facility], please consider signing up.   We recommend the following healthy lifestyle for LIFE: 1) Small portions. But, make sure to get regular (at least 3 per day), healthy meals and small healthy snacks if needed.  2) Eat a healthy clean diet.   TRY TO EAT: -at least 5-7 servings of low sugar, colorful, and nutrient rich vegetables per day (not corn, potatoes or bananas.) -berries are the best choice if you wish to eat fruit (only eat small amounts if trying to reduce weight)  -lean meets (fish, white meat of chicken or [place]) -vegan proteins for some meals - beans or tofu, whole grains, nuts and seeds -Replace bad fats with good fats - good fats include: fish, nuts and seeds, canola oil, olive oil -small amounts of low fat or non fat dairy -small amounts of100 % whole grains - check the lables -drink plenty of water  AVOID: -SUGAR, sweets, anything with added sugar, corn syrup or sweeteners - must read labels as even foods advertised as "healthy" often are loaded with sugar -if you must have a sweetener, small amounts of stevia may be best -sweetened beverages and artificially sweetened beverages -simple starches (rice, bread, potatoes, pasta, chips, etc - small amounts of 100% whole grains are ok) -red meat, pork, butter -fried foods, fast  food, processed food, excessive dairy, eggs and coconut.  3)Get at least 150 minutes of sweaty aerobic exercise per week.  4)Reduce stress - consider counseling, meditation and relaxation to balance other aspects of your life.

## 2018-05-27 NOTE — Progress Notes (Signed)
HPI:  Using dictation device. Unfortunately this device frequently misinterprets words/phrases.  Patrick Hernandez is a pleasant 62 y.o. here for follow up. Chronic medical problems summarized below were reviewed for changes and stability and were updated as needed below. These issues and their treatment remain stable for the most part. 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 Feels diet is ok. Sees his endocrinologist tomorrow. Occ feels cold when first wakes up -chronic issue. Denies sweats, wt loss, documented fevers, malaise. Denies CP, SOB, DOE, treatment intolerance or new symptoms. Reports eye exam done. Dr. Posey Pronto  HTN, HLD, CKD in the setting of diabetes: -meds: lipitor, norvasc, hctz, irbesartan -No regular exercise, diet okay  DM with renal and neurological complications: -sees Dr. Cruzita Lederer for management -Dr. Posey Pronto for eye exams -meds: asa, arb, statin, metformin, novolog, lantus   ROS: See pertinent positives and negatives per HPI.  Past Medical History:  Diagnosis Date  . 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 Hypertension   . Palpitations   . Polyneuropathy in diabetes(357.2)   . Type II or unspecified type diabetes mellitus with neurological manifestations, not stated as uncontrolled(250.60)     Past Surgical History:  Procedure Laterality Date  . None      Family History  Problem Relation Age of Onset  . Diabetes Mother   . Diabetes Father   . Heart disease Father        Pacemaker    SOCIAL HX: see hpi   Current Outpatient Medications:  .  amLODipine (NORVASC) 10 MG tablet, TAKE 1 TABLET BY MOUTH EVERY DAY, Disp: 30 tablet, Rfl: 5 .  aspirin 81 MG tablet, Take 81 mg by mouth daily., Disp: , Rfl:  .  atorvastatin (LIPITOR) 40 MG tablet, TAKE 1 TABLET (40 MG TOTAL) BY MOUTH DAILY., Disp: 90 tablet, Rfl: 1 .  BAYER MICROLET LANCETS lancets, Use to test blood sugar 3 times daily as instructed. Dx: E11.40, Disp: 100 each, Rfl: 11 .  BD PEN  NEEDLE NANO U/F 32G X 4 MM MISC, USE 4 TIMES A DAY, Disp: 100 each, Rfl: 11 .  Blood Glucose Monitoring Suppl (BAYER CONTOUR NEXT MONITOR) w/Device KIT, Use to test blood sugar daily as instructed. Dx: E11.40, Disp: 1 kit, Rfl: 0 .  CONTOUR NEXT TEST test strip, TEST BLOOD SUGAR 3 TIMES A DAY AS INSTRUCTED, Disp: 100 each, Rfl: 2 .  hydrochlorothiazide (HYDRODIURIL) 25 MG tablet, Take 1 tablet (25 mg total) by mouth daily., Disp: 90 tablet, Rfl: 1 .  Insulin Glargine (LANTUS SOLOSTAR) 100 UNIT/ML Solostar Pen, INJECT 36-38 UNITS TOTAL INTO THE SKIN AT BEDTIME., Disp: 15 pen, Rfl: 11 .  irbesartan (AVAPRO) 150 MG tablet, TAKE 1 TABLET BY MOUTH EVERY DAY, Disp: 90 tablet, Rfl: 1 .  metFORMIN (GLUCOPHAGE) 500 MG tablet, TAKE 3 TABLETS IN EVENING WITH DINNER, Disp: 270 tablet, Rfl: 3 .  NOVOLOG FLEXPEN 100 UNIT/ML FlexPen, INJECT UP TO 36 UNITS DAILY AS INSTRUCTED., Disp: 45 pen, Rfl: 1 .  pantoprazole (PROTONIX) 20 MG tablet, TAKE 1 TABLET (20 MG TOTAL) BY MOUTH DAILY., Disp: 90 tablet, Rfl: 1 .  tamsulosin (FLOMAX) 0.4 MG CAPS capsule, TAKE ONE CAPSULE BY MOUTH EVERY DAY, Disp: 30 capsule, Rfl: 5  EXAM:  Vitals:   05/27/18 1610  BP: 122/62  Pulse: 66  Temp: 97.6 F (36.4 C)    Body mass index is 30.39 kg/m.  GENERAL: vitals reviewed and listed above, alert, oriented, appears well hydrated and in no  acute distress  HEENT: atraumatic, conjunttiva clear, no obvious abnormalities on inspection of external nose and ears  NECK: no obvious masses on inspection  LUNGS: clear to auscultation bilaterally, no wheezes, rales or rhonchi, good air movement  CV: HRRR, no peripheral edema  MS: moves all extremities without noticeable abnormality  PSYCH: pleasant and cooperative, no obvious depression or anxiety  ASSESSMENT AND PLAN:  Discussed the following assessment and plan:  Diabetes mellitus type 2 with neurological manifestations ([place]) - Plan: Hemoglobin A1c  Hyperlipidemia associated  with type 2 diabetes mellitus ([place])  Hypertension associated with diabetes ([place]) - Plan: Basic metabolic panel, CBC  Stage 3 chronic kidney disease ([place])  -Labs per orders -Recommendations -Risk and benefits of aspirin therapy, opted to stop -He feels cold sometimes when he first wakes up,-to be a mild and intermittent issue, will check labs, also advised to check his temperature he feels this way to see if he is running a fever and to follow-up if fever, happening on a regular basis or persistent -Follow-up in 3 to 4 months, sooner as needed -Patient advised to return or notify a doctor immediately if symptoms worsen or persist or new concerns arise.  Patient Instructions  BEFORE YOU LEAVE: -Wendie Simmer, Obtain eye report from Dr. Posey Pronto and abstract -labs -follow up: 3-4 months  Try stopping aspirin  Take temperature if cold and let us know if fever. Let us know if any fever, if happens on regular basis or persistent.  We have ordered labs or studies at this visit. It can take up to 1-2 weeks for results and processing. IF results require follow up or explanation, we will call you with instructions. Clinically stable results will be released to your [facility]. If you have not heard from [place] or cannot find your results in [facility] in 2 weeks please contact our office at [phone].  If you are not yet signed up for [facility], please consider signing up.   We recommend the following healthy lifestyle for LIFE: 1) Small portions. But, make sure to get regular (at least 3 per day), healthy meals and small healthy snacks if needed.  2) Eat a healthy clean diet.   TRY TO EAT: -at least 5-7 servings of low sugar, colorful, and nutrient rich vegetables per day (not corn, potatoes or bananas.) -berries are the best choice if you wish to eat fruit (only eat small amounts if trying to reduce weight)  -lean meets (fish, white meat of chicken or [place]) -vegan proteins for some meals - beans or tofu,  whole grains, nuts and seeds -Replace bad fats with good fats - good fats include: fish, nuts and seeds, canola oil, olive oil -small amounts of low fat or non fat dairy -small amounts of100 % whole grains - check the lables -drink plenty of water  AVOID: -SUGAR, sweets, anything with added sugar, corn syrup or sweeteners - must read labels as even foods advertised as "healthy" often are loaded with sugar -if you must have a sweetener, small amounts of stevia may be best -sweetened beverages and artificially sweetened beverages -simple starches (rice, bread, potatoes, pasta, chips, etc - small amounts of 100% whole grains are ok) -red meat, pork, butter -fried foods, fast food, processed food, excessive dairy, eggs and coconut.  3)Get at least 150 minutes of sweaty aerobic exercise per week.  4)Reduce stress - consider counseling, meditation and relaxation to balance other aspects of your life.  Areliz Rothman R Ardelle Haliburton, DO   

## 2018-05-28 ENCOUNTER — Ambulatory Visit: Payer: BLUE CROSS/BLUE SHIELD | Admitting: Internal Medicine

## 2018-05-28 ENCOUNTER — Encounter: Payer: Self-pay | Admitting: Internal Medicine

## 2018-05-28 VITALS — BP 142/70 | HR 100 | Ht 68.5 in | Wt 202.0 lb

## 2018-05-28 DIAGNOSIS — E1149 Type 2 diabetes mellitus with other diabetic neurological complication: Secondary | ICD-10-CM | POA: Diagnosis not present

## 2018-05-28 DIAGNOSIS — E1169 Type 2 diabetes mellitus with other specified complication: Secondary | ICD-10-CM | POA: Diagnosis not present

## 2018-05-28 DIAGNOSIS — E785 Hyperlipidemia, unspecified: Secondary | ICD-10-CM | POA: Diagnosis not present

## 2018-05-28 LAB — CBC
HEMATOCRIT: 36.1 % — AB (ref 39.0–52.0)
Hemoglobin: 12.5 g/dL — ABNORMAL LOW (ref 13.0–17.0)
MCHC: 34.5 g/dL (ref 30.0–36.0)
MCV: 89.2 fl (ref 78.0–100.0)
Platelets: 276 10*3/uL (ref 150.0–400.0)
RBC: 4.05 Mil/uL — AB (ref 4.22–5.81)
RDW: 13.8 % (ref 11.5–15.5)
WBC: 9.8 10*3/uL (ref 4.0–10.5)

## 2018-05-28 LAB — BASIC METABOLIC PANEL
BUN: 26 mg/dL — ABNORMAL HIGH (ref 6–23)
CHLORIDE: 103 meq/L (ref 96–112)
CO2: 26 mEq/L (ref 19–32)
Calcium: 8.9 mg/dL (ref 8.4–10.5)
Creatinine, Ser: 1.54 mg/dL — ABNORMAL HIGH (ref 0.40–1.50)
GFR: 48.98 mL/min — ABNORMAL LOW (ref >60.00)
Glucose, Bld: 166 mg/dL — ABNORMAL HIGH (ref 70–99)
POTASSIUM: 4.2 meq/L (ref 3.5–5.1)
Sodium: 137 mEq/L (ref 135–145)

## 2018-05-28 LAB — HEMOGLOBIN A1C: HEMOGLOBIN A1C: 6.9 % — AB (ref 4.6–6.5)

## 2018-05-28 LAB — POCT GLYCOSYLATED HEMOGLOBIN (HGB A1C): Hemoglobin A1C: 6.5 % — AB (ref 4.0–5.6)

## 2018-05-28 NOTE — Patient Instructions (Signed)
Patient Instructions  Please  continue: - Metformin 1500 mg with dinner - Lantus 38 units in the evening - NovoLog:  13-15units before breakfast  6-8 units before lunch  13-15 units before dinner  Please come back for a follow-up appointment in 4 months

## 2018-05-28 NOTE — Addendum Note (Signed)
Addended by: Yolande JollyLAWSON, Clare Casto on: 05/28/2018 04:25 PM   Modules accepted: Orders

## 2018-05-28 NOTE — Progress Notes (Signed)
Patient ID: Patrick Hernandez, male   DOB: 12/27/1955, 62 y.o.   MRN: [id number]  HPI: Patrick Hernandez is a 62 y.o.-year-old male, returning for f/u for DM2, dx 2000, insulin-dependent since 2013, uncontrolled, with complications (+ DR). Last visit 3 months ago.  Last hemoglobin A1c was: Lab Results  Component Value Date   HGBA1C 6.9 (H) 01/22/2018   HGBA1C 6.6 10/23/2017   HGBA1C 6.6 07/10/2017  Improvement in diet and starting to take the insulin consistently >> improved HbA1c.  Pt is on a regimen of: - Metformin 1500 mg with dinner - Lantus 38 units in the evening - NovoLog:  13-15 units before breakfast  6-8 units before lunch  13-15 units before dinner  Pt checks his sugars 2-3X a day: - am: 96-155, 161, 170 >> 88, 126-161, 165 >> 102-165 (fruit - dinner) - 2-3h after b'fast:  88-179, 195 >> n/c  - before lunch:  74-131 >> 75-134 >> 87-127 - 2h after lunch:  116-172 >> n/c  - before dinner:  85-136, 154 >> 82-128 >> 76-121, 138, 159 - 2h after dinner: 54- 170 >> 84-145 >> n/c  - bedtime: 57, 67 , 117 >> 113, 146 >> 82 - nighttime: n/c Lowest sugar was 57 >> 75 >> 76; he has hypoglycemia awareness in the 90s. Highest sugar was 170 >> 159.  Pt's meals are: - Breakfast: brown rice, beans, soup (tofu, vegetables) - Lunch: same - Dinner: same, salads - dinner is usually very late - up to 11 PM - Snacks: none  - + CKD, last BUN/creatinine:  05/27/2018: 26/1.54 Lab Results  Component Value Date   BUN 28 (H) 02/25/2018   CREATININE 1.86 (H) 02/25/2018  On Avapro.  Creatinine trend: Lab Results  Component Value Date   CREATININE 1.86 (H) 02/25/2018   CREATININE 1.56 (H) 11/27/2017   CREATININE 1.59 (H) 08/03/2017   CREATININE 1.75 (H) 03/27/2017   CREATININE 1.86 (H) 09/26/2016   -+ HL; last set of lipids: Lab Results  Component Value Date   CHOL 104 08/03/2017   HDL 32.40 (L) 08/03/2017   LDLCALC 45 08/03/2017   LDLDIRECT 39.0 02/14/2016   TRIG 131.0 08/03/2017   CHOLHDL 3 08/03/2017  On Lipitor. - last eye exam was in: 03/29/2018: + DR. He had laser surgery in the past, now on intraocular injections.   - + numbness and tingling in his feet - 1-3rd toes bilaterally On ASA 81  He also has a history of BPH, GERD, HTN.  ROS: Constitutional: no weight gain/no weight loss, no fatigue, no subjective hyperthermia, no subjective hypothermia Eyes: no blurry vision, no xerophthalmia ENT: no sore throat, no nodules palpated in throat, no dysphagia, no odynophagia, no hoarseness Cardiovascular: no CP/no SOB/no palpitations/no leg swelling Respiratory: no cough/no SOB/no wheezing Gastrointestinal: no N/no V/no D/no C/no acid reflux Musculoskeletal: no muscle aches/no joint aches Skin: no rashes, no hair loss Neurological: no tremors/+ numbness/+ tingling/no dizziness  I reviewed pt's medications, allergies, PMH, social hx, family hx, and changes were documented in the history of present illness. Otherwise, unchanged from my initial visit note.  Past Medical History:  Diagnosis Date  . 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 Hypertension   . Palpitations   . Polyneuropathy in diabetes(357.2)   . Type II or unspecified type diabetes mellitus with neurological manifestations, not stated as uncontrolled(250.60)    Past Surgical History:  Procedure Laterality Date  . None     Social History   Socioeconomic History  .  Marital status: Married    Spouse name: Not on file  . Number of children: Not on file  . Years of education: Not on file  . Highest education level: Not on file  Occupational History  . Occupation: Self employed  Social Needs  . Financial resource strain: Not on file  . Food insecurity:    Worry: Not on file    Inability: Not on file  . Transportation needs:    Medical: Not on file    Non-medical: Not on file  Tobacco Use  . Smoking status: Never Smoker  . Smokeless tobacco: Never Used  Substance and Sexual Activity  . Alcohol  use: No  . Drug use: No  . Sexual activity: Not on file  Lifestyle  . Physical activity:    Days per week: Not on file    Minutes per session: Not on file  . Stress: Not on file  Relationships  . Social connections:    Talks on phone: Not on file    Gets together: Not on file    Attends religious service: Not on file    Active member of club or organization: Not on file    Attends meetings of clubs or organizations: Not on file    Relationship status: Not on file  . Intimate partner violence:    Fear of current or ex partner: Not on file    Emotionally abused: Not on file    Physically abused: Not on file    Forced sexual activity: Not on file  Other Topics Concern  . Not on file  Social History Narrative   Married   Self employed - dry cleaning business   Current Outpatient Medications on File Prior to Visit  Medication Sig Dispense Refill  . amLODipine (NORVASC) 10 MG tablet TAKE 1 TABLET BY MOUTH EVERY DAY 30 tablet 5  . aspirin 81 MG tablet Take 81 mg by mouth daily.    Marland Kitchen atorvastatin (LIPITOR) 40 MG tablet TAKE 1 TABLET (40 MG TOTAL) BY MOUTH DAILY. 90 tablet 1  . BAYER MICROLET LANCETS lancets Use to test blood sugar 3 times daily as instructed. Dx: E11.40 100 each 11  . BD PEN NEEDLE NANO U/F 32G X 4 MM MISC USE 4 TIMES A DAY 100 each 11  . Blood Glucose Monitoring Suppl (BAYER CONTOUR NEXT MONITOR) w/Device KIT Use to test blood sugar daily as instructed. Dx: E11.40 1 kit 0  . CONTOUR NEXT TEST test strip TEST BLOOD SUGAR 3 TIMES A DAY AS INSTRUCTED 100 each 2  . hydrochlorothiazide (HYDRODIURIL) 25 MG tablet Take 1 tablet (25 mg total) by mouth daily. 90 tablet 1  . Insulin Glargine (LANTUS SOLOSTAR) 100 UNIT/ML Solostar Pen INJECT 36-38 UNITS TOTAL INTO THE SKIN AT BEDTIME. 15 pen 11  . irbesartan (AVAPRO) 150 MG tablet TAKE 1 TABLET BY MOUTH EVERY DAY 90 tablet 1  . metFORMIN (GLUCOPHAGE) 500 MG tablet TAKE 3 TABLETS IN EVENING WITH DINNER 270 tablet 3  . NOVOLOG  FLEXPEN 100 UNIT/ML FlexPen INJECT UP TO 36 UNITS DAILY AS INSTRUCTED. 45 pen 1  . pantoprazole (PROTONIX) 20 MG tablet TAKE 1 TABLET (20 MG TOTAL) BY MOUTH DAILY. 90 tablet 1  . tamsulosin (FLOMAX) 0.4 MG CAPS capsule TAKE ONE CAPSULE BY MOUTH EVERY DAY 30 capsule 5   No current facility-administered medications on file prior to visit.    No Known Allergies Family History  Problem Relation Age of Onset  . Diabetes Mother   .  Diabetes Father   . Heart disease Father        Pacemaker    PE: BP (!) 142/70   Pulse 100   Ht 5' 8.5" (1.74 m)   Wt 202 lb (91.6 kg)   SpO2 94%   BMI 30.27 kg/m  Body mass index is 30.27 kg/m.   Wt Readings from Last 3 Encounters:  05/28/18 202 lb (91.6 kg)  05/27/18 202 lb 12.8 oz (92 kg)  03/09/18 201 lb 3.2 oz (91.3 kg)   Constitutional: overweight, in NAD Eyes: PERRLA, EOMI, no exophthalmos ENT: moist mucous membranes, no thyromegaly, no cervical lymphadenopathy Cardiovascular: RRR, No MRG Respiratory: CTA B Gastrointestinal: abdomen soft, NT, ND, BS+ Musculoskeletal: no deformities, strength intact in all 4 Skin: moist, warm, no rashes Neurological: no tremor with outstretched hands, DTR normal in all 4  ASSESSMENT: 1. DM2, insulin-dependent, uncontrolled, without complications - ? PAD (decreased pulses in LE) - DR  2. HL  PLAN:  1. Patient with long standing, now better controlled DM2, on basal-bolus insulin regimen + Metformin. At last visit, his am sugars were higher, as he was eating a late dinner. However, sugars after dinner were low >> decreased NovoLog with dinner. We also decreased Lantus a little to avoid further lows at night. - at this visit, sugars are mostly at goal, w/o significant lows or hyperglycemic spikes.  His sugars are usually higher in the morning after his late dinner and fruit afterwards, up to 160s, however, the rest of the sugars are at goal - no change in regimen is needed - I suggested to:  Patient  Instructions  Please  continue: - Metformin 1500 mg with dinner - Lantus 38 units in the evening - NovoLog:  13-15 units before breakfast  6-8 units before lunch  13-15 units before dinner  Please come back for a follow-up appointment in 4 months   - today, HbA1c is 6.5% (lower) - continue checking sugars at different times of the day - check 3x a day, rotating checks - advised for yearly eye exams >> he is UTD - Return to clinic in 4 mo with sugar log    2. HL - Reviewed latest lipid panel from 07/2017: LDL at goal, HDL slightly low, triglycerides normal Lab Results  Component Value Date   CHOL 104 08/03/2017   HDL 32.40 (L) 08/03/2017   LDLCALC 45 08/03/2017   LDLDIRECT 39.0 02/14/2016   TRIG 131.0 08/03/2017   CHOLHDL 3 08/03/2017  - Continues Lipitor without side effects.  Contact:  Beverlee Nims (daughter) - in [facility]: [phone]   Philemon Kingdom, MD PhD [facility]

## 2018-06-22 ENCOUNTER — Encounter: Payer: Self-pay | Admitting: Podiatry

## 2018-06-22 ENCOUNTER — Ambulatory Visit: Payer: BLUE CROSS/BLUE SHIELD | Admitting: Podiatry

## 2018-06-22 DIAGNOSIS — B351 Tinea unguium: Secondary | ICD-10-CM

## 2018-06-22 DIAGNOSIS — E1149 Type 2 diabetes mellitus with other diabetic neurological complication: Secondary | ICD-10-CM | POA: Diagnosis not present

## 2018-06-22 DIAGNOSIS — M79675 Pain in left toe(s): Secondary | ICD-10-CM

## 2018-06-22 DIAGNOSIS — M79674 Pain in right toe(s): Secondary | ICD-10-CM

## 2018-06-23 NOTE — Progress Notes (Signed)
Subjective: 62 y.o. returns the office today for painful, elongated, thickened toenails which he cannot trim himself. Denies any redness or drainage around the nails. The nerve symptoms he was having before are the same and wants to hold off on any medication and watch this. Denies any acute changes since last appointment and no new complaints today. Denies any systemic complaints such as fevers, chills, nausea, vomiting.   PCP: Terressa KoyanagiKim, Hannah R, DO  Objective: AAO 3, NAD DP/PT pulses palpable, CRT less than 3 seconds Nails hypertrophic, dystrophic, elongated, brittle, discolored 10. There is tenderness overlying the nails 1-5 bilaterally. There is no surrounding erythema or drainage along the nail sites. No open lesions or pre-ulcerative lesions are identified. No other areas of tenderness bilateral lower extremities. No overlying edema, erythema, increased warmth. No pain with calf compression, swelling, warmth, erythema.  Assessment: Patient presents with symptomatic onychomycosis  Plan: -Treatment options including alternatives, risks, complications were discussed -Nails sharply debrided 10 without complication/bleeding. -Discussed daily foot inspection. If there are any changes, to call the office immediately.  -Follow-up in 3 months or sooner if any problems are to arise. In the meantime, encouraged to call the office with any questions, concerns, changes symptoms.  Ovid CurdMatthew Siler Mavis, DPM

## 2018-06-25 ENCOUNTER — Telehealth: Payer: Self-pay | Admitting: Podiatry

## 2018-06-28 ENCOUNTER — Ambulatory Visit (INDEPENDENT_AMBULATORY_CARE_PROVIDER_SITE_OTHER): Payer: BLUE CROSS/BLUE SHIELD

## 2018-06-28 ENCOUNTER — Encounter: Payer: Self-pay | Admitting: Podiatry

## 2018-06-28 ENCOUNTER — Ambulatory Visit: Payer: BLUE CROSS/BLUE SHIELD | Admitting: Podiatry

## 2018-06-28 DIAGNOSIS — L97521 Non-pressure chronic ulcer of other part of left foot limited to breakdown of skin: Secondary | ICD-10-CM | POA: Diagnosis not present

## 2018-06-28 DIAGNOSIS — E1149 Type 2 diabetes mellitus with other diabetic neurological complication: Secondary | ICD-10-CM | POA: Diagnosis not present

## 2018-06-28 MED ORDER — MUPIROCIN 2 % EX OINT: 1.0000 "application " | TOPICAL_OINTMENT | Freq: Two times a day (BID) | CUTANEOUS | 2 refills | Status: AC

## 2018-06-28 NOTE — Progress Notes (Signed)
Subjective: 62 year old male presents the office today for new concerns of a wound on the bottom of his left big toe.  It started a couple days after I last saw him he is unsure how it started.  He states the area was bleeding but he denies today any foreign objects.  Denies any blisters and denies any redness or drainage or any swelling.  He has no other concerns. Denies any systemic complaints such as fevers, chills, nausea, vomiting. No acute changes since last appointment, and no other complaints at this time.   Objective: AAO x3, NAD DP/PT pulses palpable bilaterally, CRT less than 3 seconds On the plantar aspect the left hallux is a annular ulceration measuring 0.5 x 0.5 cm and superficial with a granular wound base.  There is no probing to bone, undermining or tunneling.  There is no surrounding erythema, ascending cellulitis.  There is no fluctuation or crepitation or any malodor. No open lesions or pre-ulcerative lesions.  No pain with calf compression, swelling, warmth, erythema      Assessment: Left hallux ulcer  Plan: -All treatment options discussed with the patient including all alternatives, risks, complications.  -X-rays were obtained and reviewed.  No evidence of foreign body, acute osteomyelitis.  The lateral view there is a small inferior spurring of the phalanx. -Antibiotic ointment was ordered today, mupirocin ointment. -Surgical shoe with offloading pad was dispensed today. -We will order ABI to TBI he would like this done at [facility] on [address] -Monitor for any clinical signs or symptoms of infection and directed to call the office immediately should any occur or go to the ER. -Patient encouraged to call the office with any questions, concerns, change in symptoms.   Return in about 10 days (around 07/08/2018).  Vivi BarrackMatthew R Wagoner DPM

## 2018-07-02 ENCOUNTER — Other Ambulatory Visit: Payer: Self-pay | Admitting: Internal Medicine

## 2018-07-08 ENCOUNTER — Encounter: Payer: Self-pay | Admitting: Podiatry

## 2018-07-08 ENCOUNTER — Ambulatory Visit: Payer: BLUE CROSS/BLUE SHIELD | Admitting: Podiatry

## 2018-07-08 DIAGNOSIS — L97521 Non-pressure chronic ulcer of other part of left foot limited to breakdown of skin: Secondary | ICD-10-CM

## 2018-07-08 DIAGNOSIS — E1149 Type 2 diabetes mellitus with other diabetic neurological complication: Secondary | ICD-10-CM

## 2018-07-08 NOTE — Patient Instructions (Signed)
Continue antibiotic ointment two times a day until the area has healed. I will see you back in 2-3 weeks if not healed.  Monitor for any signs/symptoms of infection. Call the office immediately if any occur or go directly to the emergency room. Call with any questions/concerns.

## 2018-07-08 NOTE — Progress Notes (Signed)
Subjective: 62 year old male presents the office for follow-up evaluation of wounds on his left big toe.  He states he is doing good and he has not noticed any drainage or pus and has no pain.  No swelling.  He has been putting the antibiotic ointment on 2 times a day.  Overall is getting better. Denies any systemic complaints such as fevers, chills, nausea, vomiting. No acute changes since last appointment, and no other complaints at this time.   Objective: AAO x3, NAD DP/PT pulses palpable bilaterally, CRT less than 3 seconds On the plantar aspect the left hallux is a wound measuring about 0.4 x 0.3 cm but it is more superficial almost healed.  This also had some mild hyperkeratotic periwound.  There is no surrounding erythema, ascending sialitis.  There is no fluctuation or crepitation or any malodor identified today.  No edema.  No open lesions or pre-ulcerative lesions.  No pain with calf compression, swelling, warmth, erythema      Assessment: Healing wound left hallux  Plan: -All treatment options discussed with the patient including all alternatives, risks, complications.  -I sharply debrided the ulceration to remove the hyperkeratotic periwound and the area is almost healed.  Continue with antibiotic ointment dressing changes daily.  Also the alternative dispensed offloading pads. -Monitor for any clinical signs or symptoms of infection and directed to call the office immediately should any occur or go to the ER. -Follow-up in 2-3 weeks of toenail or sooner if needed. -Patient encouraged to call the office with any questions, concerns, change in symptoms.   Vivi BarrackMatthew R Wagoner DPM

## 2018-07-25 ENCOUNTER — Other Ambulatory Visit: Payer: Self-pay | Admitting: Family Medicine

## 2018-07-29 ENCOUNTER — Ambulatory Visit: Payer: BLUE CROSS/BLUE SHIELD | Admitting: Podiatry

## 2018-07-29 DIAGNOSIS — L97521 Non-pressure chronic ulcer of other part of left foot limited to breakdown of skin: Secondary | ICD-10-CM

## 2018-07-29 DIAGNOSIS — E1149 Type 2 diabetes mellitus with other diabetic neurological complication: Secondary | ICD-10-CM | POA: Diagnosis not present

## 2018-07-29 DIAGNOSIS — L84 Corns and callosities: Secondary | ICD-10-CM

## 2018-08-02 NOTE — Progress Notes (Signed)
Subjective: 62 year old male presents the office for follow-up evaluation of wounds on his left big toe.  He states the area is doing well.  He denies any drainage or pus coming from the toe and denies any swelling to the foot.  The wound appears to be healed.  He has no other concerns today. Denies any systemic complaints such as fevers, chills, nausea, vomiting. No acute changes since last appointment, and no other complaints at this time.   Objective: AAO x3, NAD-presents today with a translator DP/PT pulses palpable bilaterally, CRT less than 3 seconds On the plantar aspect the left hallux is a hyperkeratotic lesion.  Upon debridement appears that the wound is healed.  There is no drainage or pus or any signs of infection.  There is also pre-ulcerative area submetatarsal 1 and 5.  No underlying ulceration drainage or any signs of infection noted from these areas today.  No other open lesions or pre-ulcerative lesions identified. No pain with calf compression, swelling, warmth, erythema  Assessment: Healing wound left hallux  Plan: -All treatment options discussed with the patient including all alternatives, risks, complications.  -I debrided the hyperkeratotic lesions as well as the callus overlying the previous wound without any complications or bleeding.  Appears the wound is healed.  Recommend moisturizer to his feet daily.  Daily foot inspection discussed.  I will see him back as scheduled for routine care but if there is any issues before then to let me know and he agrees with this plan.  Vivi BarrackMatthew R Cassady Turano DPM

## 2018-08-09 ENCOUNTER — Other Ambulatory Visit: Payer: Self-pay | Admitting: Family Medicine

## 2018-08-17 ENCOUNTER — Encounter: Payer: Self-pay | Admitting: *Deleted

## 2018-08-18 ENCOUNTER — Other Ambulatory Visit: Payer: Self-pay | Admitting: Family Medicine

## 2018-08-19 ENCOUNTER — Other Ambulatory Visit: Payer: Self-pay | Admitting: Family Medicine

## 2018-09-14 ENCOUNTER — Ambulatory Visit: Payer: BLUE CROSS/BLUE SHIELD | Admitting: Podiatry

## 2018-09-14 DIAGNOSIS — M79674 Pain in right toe(s): Secondary | ICD-10-CM

## 2018-09-14 DIAGNOSIS — B351 Tinea unguium: Secondary | ICD-10-CM

## 2018-09-14 DIAGNOSIS — E1142 Type 2 diabetes mellitus with diabetic polyneuropathy: Secondary | ICD-10-CM | POA: Diagnosis not present

## 2018-09-14 DIAGNOSIS — M79675 Pain in left toe(s): Secondary | ICD-10-CM

## 2018-09-14 DIAGNOSIS — L84 Corns and callosities: Secondary | ICD-10-CM | POA: Diagnosis not present

## 2018-09-14 NOTE — Patient Instructions (Signed)

## 2018-09-15 ENCOUNTER — Other Ambulatory Visit: Payer: Self-pay | Admitting: Family Medicine

## 2018-09-22 ENCOUNTER — Other Ambulatory Visit: Payer: Self-pay | Admitting: Internal Medicine

## 2018-09-22 DIAGNOSIS — E1149 Type 2 diabetes mellitus with other diabetic neurological complication: Secondary | ICD-10-CM

## 2018-09-27 ENCOUNTER — Other Ambulatory Visit: Payer: Self-pay | Admitting: Internal Medicine

## 2018-09-27 DIAGNOSIS — E1149 Type 2 diabetes mellitus with other diabetic neurological complication: Secondary | ICD-10-CM

## 2018-09-30 ENCOUNTER — Ambulatory Visit: Payer: BLUE CROSS/BLUE SHIELD | Admitting: Family Medicine

## 2018-09-30 ENCOUNTER — Encounter: Payer: Self-pay | Admitting: Family Medicine

## 2018-09-30 VITALS — BP 120/68 | HR 97 | Temp 98.4°F | Ht 68.5 in | Wt 202.4 lb

## 2018-09-30 DIAGNOSIS — N183 Chronic kidney disease, stage 3 unspecified: Secondary | ICD-10-CM

## 2018-09-30 DIAGNOSIS — E1169 Type 2 diabetes mellitus with other specified complication: Secondary | ICD-10-CM | POA: Diagnosis not present

## 2018-09-30 DIAGNOSIS — E1159 Type 2 diabetes mellitus with other circulatory complications: Secondary | ICD-10-CM

## 2018-09-30 DIAGNOSIS — E1149 Type 2 diabetes mellitus with other diabetic neurological complication: Secondary | ICD-10-CM

## 2018-09-30 DIAGNOSIS — E785 Hyperlipidemia, unspecified: Secondary | ICD-10-CM

## 2018-09-30 DIAGNOSIS — I152 Hypertension secondary to endocrine disorders: Secondary | ICD-10-CM

## 2018-09-30 DIAGNOSIS — I10 Essential (primary) hypertension: Secondary | ICD-10-CM

## 2018-09-30 NOTE — Progress Notes (Signed)
HPI:  Using dictation device. Unfortunately this device frequently misinterprets words/phrases.  Patrick Hernandez is a pleasant 62 y.o. here for follow up. Chronic medical problems summarized below were reviewed for changes and stability and were updated as needed below. These issues and their treatment remain stable for the most part.  Reports doing well.  No concerns today.  No regular exercise.  Diet okay.  Denies CP, SOB, DOE, treatment intolerance or new symptoms. Due for fasting labs - recheck cbc.  HTN, HLD, CKD in the setting of diabetes: -meds: lipitor, norvasc, hctz, irbesartan -No regular exercise, diet okay  DM with renal and neurological complications: -sees Dr. Cruzita Lederer for management -Dr. Posey Pronto for eye exams -meds: asa, arb, statin, metformin, novolog, lantus  ROS: See pertinent positives and negatives per HPI.  Past Medical History:  Diagnosis Date  . 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 Hypertension   . Palpitations   . Polyneuropathy in diabetes(357.2)   . Type II or unspecified type diabetes mellitus with neurological manifestations, not stated as uncontrolled(250.60)     Past Surgical History:  Procedure Laterality Date  . None      Family History  Problem Relation Age of Onset  . Diabetes Mother   . Diabetes Father   . Heart disease Father        Pacemaker    SOCIAL HX: See HPI   Current Outpatient Medications:  .  amLODipine (NORVASC) 10 MG tablet, TAKE 1 TABLET BY MOUTH EVERY DAY, Disp: 30 tablet, Rfl: 5 .  aspirin 81 MG tablet, Take 81 mg by mouth daily., Disp: , Rfl:  .  atorvastatin (LIPITOR) 40 MG tablet, TAKE 1 TABLET (40 MG TOTAL) BY MOUTH DAILY., Disp: 90 tablet, Rfl: 1 .  BAYER MICROLET LANCETS lancets, Use to test blood sugar 3 times daily as instructed. Dx: E11.40, Disp: 100 each, Rfl: 11 .  BD PEN NEEDLE NANO U/F 32G X 4 MM MISC, USE 4 TIMES A DAY, Disp: 100 each, Rfl: 11 .  Blood Glucose Monitoring Suppl (BAYER CONTOUR NEXT MONITOR)  w/Device KIT, Use to test blood sugar daily as instructed. Dx: E11.40, Disp: 1 kit, Rfl: 0 .  CONTOUR NEXT TEST test strip, TEST BLOOD SUGAR 3 TIMES A DAY AS INSTRUCTED, Disp: 100 each, Rfl: 2 .  hydrochlorothiazide (HYDRODIURIL) 25 MG tablet, TAKE 1 TABLET BY MOUTH EVERY DAY, Disp: 90 tablet, Rfl: 1 .  Insulin Glargine (LANTUS SOLOSTAR) 100 UNIT/ML Solostar Pen, INJECT 36-38 UNITS TOTAL INTO THE SKIN AT BEDTIME., Disp: 15 pen, Rfl: 11 .  irbesartan (AVAPRO) 150 MG tablet, TAKE 1 TABLET BY MOUTH EVERY DAY, Disp: 90 tablet, Rfl: 1 .  metFORMIN (GLUCOPHAGE) 500 MG tablet, TAKE 3 TABLETS BY MOUTH IN EVENING WITH DINNER, Disp: 270 tablet, Rfl: 3 .  mupirocin ointment (BACTROBAN) 2 %, Apply 1 application topically 2 (two) times daily., Disp: 30 g, Rfl: 2 .  NOVOLOG FLEXPEN 100 UNIT/ML FlexPen, INJECT UP TO 36 UNITS DAILY AS INSTRUCTED., Disp: 45 mL, Rfl: 1 .  pantoprazole (PROTONIX) 20 MG tablet, TAKE 1 TABLET (20 MG TOTAL) BY MOUTH DAILY., Disp: 90 tablet, Rfl: 1 .  tamsulosin (FLOMAX) 0.4 MG CAPS capsule, TAKE ONE CAPSULE BY MOUTH EVERY DAY, Disp: 30 capsule, Rfl: 5  EXAM:  Vitals:   09/30/18 1618  BP: 120/68  Pulse: 97  Temp: 98.4 F (36.9 C)    Body mass index is 30.33 kg/m.  GENERAL: vitals reviewed and listed above, alert, oriented, appears well hydrated and in  no acute distress  HEENT: atraumatic, conjunttiva clear, no obvious abnormalities on inspection of external nose and ears  NECK: no obvious masses on inspection  LUNGS: clear to auscultation bilaterally, no wheezes, rales or rhonchi, good air movement  CV: HRRR, no peripheral edema  MS: moves all extremities without noticeable abnormality  PSYCH: pleasant and cooperative, no obvious depression or anxiety  ASSESSMENT AND PLAN:  Discussed the following assessment and plan:  Diabetes mellitus type 2 with neurological manifestations (HCC)  Hypertension associated with diabetes ([place]) - Plan: Basic metabolic panel,  CBC  Hyperlipidemia associated with type 2 diabetes mellitus ([place]) - Plan: Lipid panel  Stage 3 chronic kidney disease ([place]) - Plan: Hemoglobin A1c  -Blood pressure looks good today -He is due for his fasting labs and agrees to schedule an appointment for this -Sees Dr. Renne Crigler endocrinology for his diabetes -Recommended a healthy Mediterranean style diet and regular aerobic exercise, see handout -Continue current medications pending lab results -Due for physical at his next follow-up, he can schedule this as he leaves -Follow-up sooner as needed  Patient Instructions  BEFORE YOU LEAVE: -phq9 in Epic due -lab visit in next 1-2 weeks for fasting labs -follow up: CPE in 3-4 months  We have ordered labs or studies at this visit. It can take up to 1-2 weeks for results and processing. IF results require follow up or explanation, we will call you with instructions. Clinically stable results will be released to your [facility]. If you have not heard from [place] or cannot find your results in [facility] in 2 weeks please contact our office at [phone].  If you are not yet signed up for [facility], please consider signing up.    We recommend the following healthy lifestyle for LIFE: 1) Small portions. But, make sure to get regular (at least 3 per day), healthy meals and small healthy snacks if needed.  2) Eat a healthy clean diet.   TRY TO EAT: -at least 5-7 servings of low sugar, colorful, and nutrient rich vegetables per day (not corn, potatoes or bananas.) -berries are the best choice if you wish to eat fruit (only eat small amounts if trying to reduce weight)  -lean meets (fish, white meat of chicken or [place]) -vegan proteins for some meals - beans or tofu, whole grains, nuts and seeds -Replace bad fats with good fats - good fats include: fish, nuts and seeds, canola oil, olive oil -small amounts of low fat or non fat dairy -small amounts of100 % whole grains - check the lables -drink plenty  of water  AVOID: -SUGAR, sweets, anything with added sugar, corn syrup or sweeteners - must read labels as even foods advertised as "healthy" often are loaded with sugar -if you must have a sweetener, small amounts of stevia may be best -sweetened beverages and artificially sweetened beverages -simple starches (rice, bread, potatoes, pasta, chips, etc - small amounts of 100% whole grains are ok) -red meat, pork, butter -fried foods, fast food, processed food, excessive dairy, eggs and coconut.  3)Get at least 150 minutes of sweaty aerobic exercise per week.  4)Reduce stress - consider counseling, meditation and relaxation to balance other aspects of your life.         Lucretia Kern, DO

## 2018-09-30 NOTE — Patient Instructions (Addendum)
BEFORE YOU LEAVE: -phq9 in Epic due -lab visit in next 1-2 weeks for fasting labs -follow up: CPE in 3-4 months  We have ordered labs or studies at this visit. It can take up to 1-2 weeks for results and processing. IF results require follow up or explanation, we will call you with instructions. Clinically stable results will be released to your [facility]. If you have not heard from us or cannot find your results in [facility] in 2 weeks please contact our office at [phone].  If you are not yet signed up for [facility], please consider signing up.    We recommend the following healthy lifestyle for LIFE: 1) Small portions. But, make sure to get regular (at least 3 per day), healthy meals and small healthy snacks if needed.  2) Eat a healthy clean diet.   TRY TO EAT: -at least 5-7 servings of low sugar, colorful, and nutrient rich vegetables per day (not corn, potatoes or bananas.) -berries are the best choice if you wish to eat fruit (only eat small amounts if trying to reduce weight)  -lean meets (fish, white meat of chicken or [place]) -vegan proteins for some meals - beans or tofu, whole grains, nuts and seeds -Replace bad fats with good fats - good fats include: fish, nuts and seeds, canola oil, olive oil -small amounts of low fat or non fat dairy -small amounts of100 % whole grains - check the lables -drink plenty of water  AVOID: -SUGAR, sweets, anything with added sugar, corn syrup or sweeteners - must read labels as even foods advertised as "healthy" often are loaded with sugar -if you must have a sweetener, small amounts of stevia may be best -sweetened beverages and artificially sweetened beverages -simple starches (rice, bread, potatoes, pasta, chips, etc - small amounts of 100% whole grains are ok) -red meat, pork, butter -fried foods, fast food, processed food, excessive dairy, eggs and coconut.  3)Get at least 150 minutes of sweaty aerobic exercise per week.  4)Reduce  stress - consider counseling, meditation and relaxation to balance other aspects of your life.

## 2018-10-01 ENCOUNTER — Encounter: Payer: Self-pay | Admitting: Internal Medicine

## 2018-10-01 ENCOUNTER — Ambulatory Visit (INDEPENDENT_AMBULATORY_CARE_PROVIDER_SITE_OTHER): Payer: BLUE CROSS/BLUE SHIELD | Admitting: Internal Medicine

## 2018-10-01 VITALS — BP 120/60 | HR 88 | Ht 68.5 in | Wt 202.0 lb

## 2018-10-01 DIAGNOSIS — E1149 Type 2 diabetes mellitus with other diabetic neurological complication: Secondary | ICD-10-CM | POA: Diagnosis not present

## 2018-10-01 DIAGNOSIS — E785 Hyperlipidemia, unspecified: Secondary | ICD-10-CM

## 2018-10-01 DIAGNOSIS — E1169 Type 2 diabetes mellitus with other specified complication: Secondary | ICD-10-CM | POA: Diagnosis not present

## 2018-10-01 LAB — POCT GLYCOSYLATED HEMOGLOBIN (HGB A1C): HEMOGLOBIN A1C: 6 % — AB (ref 4.0–5.6)

## 2018-10-01 NOTE — Patient Instructions (Signed)
Please continue: - Metformin 1500 mg with dinner - Lantus 38 units in the evening - NovoLog:  13-15 units before breakfast  6-8 units before lunch  13-15 units before dinner  Please come back for a follow-up appointment in 4 months

## 2018-10-01 NOTE — Progress Notes (Signed)
Patient ID: Patrick Hernandez, male   DOB: [birth year], 62 y.o.   MRN: [id number]  HPI: Patrick Hernandez is a 62 y.o.-year-old male, returning for f/u for DM2, dx 2000, insulin-dependent since 2013, uncontrolled, with complications (+ DR). Last visit 4 months ago.  He is here with the [place] interpreter who translates for [place].  Last hemoglobin A1c was: Lab Results  Component Value Date   HGBA1C 6.5 (A) 05/28/2018   HGBA1C 6.9 (H) 05/27/2018   HGBA1C 6.9 (H) 01/22/2018  Improvement in diet and starting to take the insulin consistently >> improved HbA1c.  Pt is on a regimen of: - Metformin 1500 mg with dinner - Lantus 38 units in the evening - NovoLog:  13-15 units before breakfast  6-8 units before lunch  13-15 units before dinner  Pt checks his sugars 2-3 X a day: - am:  88, 126-161, 165 >> 102-165 (fruit - dinner) >> 99-167, 185 - 2-3h after b'fast:  88-179, 195 >> n/c  - before lunch:  74-131 >> 75-134 >> 87-127 >> 91-125, 137 - 2h after lunch:  116-172 >> n/c  - before dinner: 82-128 >> 76-121, 138, 159 >> 84-151 - 2h after dinner: 54- 170 >> 84-145 >> n/c  - bedtime: 57, 67 , 117 >> 113, 146 >> 82 >> n/c - nighttime: n/c Lowest sugar was 57 >> 75 >> 76 >> 84; he has hypoglycemia awareness in the 90s Highest sugar was 170 >> 159 185.  Pt's meals are: - Breakfast: brown rice, beans, soup (tofu, vegetables) - Lunch: same - Dinner: same, salads - dinner is usually very late - up to 11 PM - Snacks: none  -+ CKD, last BUN/creatinine:  Lab Results  Component Value Date   BUN 26 (H) 05/27/2018   CREATININE 1.54 (H) 05/27/2018  On Avapro.  Creatinine trends: Lab Results  Component Value Date   CREATININE 1.54 (H) 05/27/2018   CREATININE 1.86 (H) 02/25/2018   CREATININE 1.56 (H) 11/27/2017   CREATININE 1.59 (H) 08/03/2017   CREATININE 1.75 (H) 03/27/2017   -+ HL; last set of lipids: Lab Results  Component Value Date   CHOL 104 08/03/2017   HDL 32.40 (L) 08/03/2017   LDLCALC 45 08/03/2017   LDLDIRECT 39.0 02/14/2016   TRIG 131.0 08/03/2017   CHOLHDL 3 08/03/2017  On Lipitor. - last eye exam was in: 03/2018: + DR. he had laser surgery in the past, known intraocular injections. -+ Numbness and tingling in his feet-1-3rd toes bilaterally  He also has a history of BPH, GERD, HTN.  ROS: Constitutional: no weight gain/no weight loss, no fatigue, no subjective hyperthermia, no subjective hypothermia Eyes: no blurry vision, no xerophthalmia ENT: no sore throat, no nodules palpated in neck, no dysphagia, no odynophagia, no hoarseness Cardiovascular: no CP/no SOB/no palpitations/+ leg swelling Respiratory: no cough/no SOB/no wheezing Gastrointestinal: no N/no V/no D/no C/no acid reflux Musculoskeletal: no muscle aches/no joint aches Skin: no rashes, no hair loss Neurological: no tremors/no numbness/no tingling/no dizziness  I reviewed pt's medications, allergies, PMH, social hx, family hx, and changes were documented in the history of present illness. Otherwise, unchanged from my initial visit note.  Past Medical History:  Diagnosis Date  . 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 Hypertension   . Palpitations   . Polyneuropathy in diabetes(357.2)   . Type II or unspecified type diabetes mellitus with neurological manifestations, not stated as uncontrolled(250.60)    Past Surgical History:  Procedure Laterality Date  . None  Social History   Socioeconomic History  . Marital status: Married    Spouse name: Not on file  . Number of children: Not on file  . Years of education: Not on file  . Highest education level: Not on file  Occupational History  . Occupation: Self employed  Social Needs  . Financial resource strain: Not on file  . Food insecurity:    Worry: Not on file    Inability: Not on file  . Transportation needs:    Medical: Not on file    Non-medical: Not on file  Tobacco Use  . Smoking status: Never Smoker  . Smokeless  tobacco: Never Used  Substance and Sexual Activity  . Alcohol use: No  . Drug use: No  . Sexual activity: Not on file  Lifestyle  . Physical activity:    Days per week: Not on file    Minutes per session: Not on file  . Stress: Not on file  Relationships  . Social connections:    Talks on phone: Not on file    Gets together: Not on file    Attends religious service: Not on file    Active member of club or organization: Not on file    Attends meetings of clubs or organizations: Not on file    Relationship status: Not on file  . Intimate partner violence:    Fear of current or ex partner: Not on file    Emotionally abused: Not on file    Physically abused: Not on file    Forced sexual activity: Not on file  Other Topics Concern  . Not on file  Social History Narrative   Married   Self employed - dry cleaning business   Current Outpatient Medications on File Prior to Visit  Medication Sig Dispense Refill  . amLODipine (NORVASC) 10 MG tablet TAKE 1 TABLET BY MOUTH EVERY DAY 30 tablet 5  . aspirin 81 MG tablet Take 81 mg by mouth daily.    Marland Kitchen atorvastatin (LIPITOR) 40 MG tablet TAKE 1 TABLET (40 MG TOTAL) BY MOUTH DAILY. 90 tablet 1  . BAYER MICROLET LANCETS lancets Use to test blood sugar 3 times daily as instructed. Dx: E11.40 100 each 11  . BD PEN NEEDLE NANO U/F 32G X 4 MM MISC USE 4 TIMES A DAY 100 each 11  . Blood Glucose Monitoring Suppl (BAYER CONTOUR NEXT MONITOR) w/Device KIT Use to test blood sugar daily as instructed. Dx: E11.40 1 kit 0  . CONTOUR NEXT TEST test strip TEST BLOOD SUGAR 3 TIMES A DAY AS INSTRUCTED 100 each 2  . hydrochlorothiazide (HYDRODIURIL) 25 MG tablet TAKE 1 TABLET BY MOUTH EVERY DAY 90 tablet 1  . Insulin Glargine (LANTUS SOLOSTAR) 100 UNIT/ML Solostar Pen INJECT 36-38 UNITS TOTAL INTO THE SKIN AT BEDTIME. 15 pen 11  . irbesartan (AVAPRO) 150 MG tablet TAKE 1 TABLET BY MOUTH EVERY DAY 90 tablet 1  . metFORMIN (GLUCOPHAGE) 500 MG tablet TAKE 3  TABLETS BY MOUTH IN EVENING WITH DINNER 270 tablet 3  . mupirocin ointment (BACTROBAN) 2 % Apply 1 application topically 2 (two) times daily. 30 g 2  . NOVOLOG FLEXPEN 100 UNIT/ML FlexPen INJECT UP TO 36 UNITS DAILY AS INSTRUCTED. 45 mL 1  . pantoprazole (PROTONIX) 20 MG tablet TAKE 1 TABLET (20 MG TOTAL) BY MOUTH DAILY. 90 tablet 1  . tamsulosin (FLOMAX) 0.4 MG CAPS capsule TAKE ONE CAPSULE BY MOUTH EVERY DAY 30 capsule 5   No current facility-administered  medications on file prior to visit.    No Known Allergies Family History  Problem Relation Age of Onset  . Diabetes Mother   . Diabetes Father   . Heart disease Father        Pacemaker    PE: BP 120/60   Pulse 88   Ht 5' 8.5" (1.74 m) Comment: measured  Wt 202 lb (91.6 kg)   SpO2 96%   BMI 30.27 kg/m  Body mass index is 30.27 kg/m.   Wt Readings from Last 3 Encounters:  10/01/18 202 lb (91.6 kg)  09/30/18 202 lb 6.4 oz (91.8 kg)  05/28/18 202 lb (91.6 kg)   Constitutional: overweight, in NAD Eyes: PERRLA, EOMI, no exophthalmos ENT: moist mucous membranes, no thyromegaly, no cervical lymphadenopathy Cardiovascular: RRR, No MRG, + pitting B LE edema Respiratory: CTA B Gastrointestinal: abdomen soft, NT, ND, BS+ Musculoskeletal: no deformities, strength intact in all 4 Skin: moist, warm, no rashes Neurological: no tremor with outstretched hands, DTR normal in all 4  ASSESSMENT: 1. DM2, insulin-dependent, uncontrolled, without complications - ? PAD (decreased pulses in LE) - DR  2. HL  3.  Leg swelling  PLAN:  1. Patient with long-standing, now better controlled diabetes, on metformin and basal-bolus insulin regimen.  He continues to have occasional high blood sugars in the morning after eating a late/large dinner.  They are usually at goal, but with spikes in the 160s and even had one sugar at 185.  The rest of the day he usually spends in the normal range.  He is doing a great job adjusting his rapid acting  insulin to his meals. -No need to change his regimen - I suggested to:  Patient Instructions  Please continue: - Metformin 1500 mg with dinner - Lantus 38 units in the evening - NovoLog:  13-15 units before breakfast  6-8 units before lunch  13-15 units before dinner  Please come back for a follow-up appointment in 4 months   - today, HbA1c is 6.0% (better!) - continue checking sugars at different times of the day - check 1x a day, rotating checks - advised for yearly eye exams >> he is UTD -+ UTD with flu shot - Return to clinic in 4 mo with sugar log     2. HL - Reviewed latest lipid panel from 07/2017: LDL at goal, HDL slightly low, triglycerides normal Lab Results  Component Value Date   CHOL 104 08/03/2017   HDL 32.40 (L) 08/03/2017   LDLCALC 45 08/03/2017   LDLDIRECT 39.0 02/14/2016   TRIG 131.0 08/03/2017   CHOLHDL 3 08/03/2017  - Continues Lipitor without side effects. - He is due for another lipid panel -he has this ordered at PCPs office  3.  Leg swelling -Bilateral pitting edema -Reviewing his medication list, he is on amlodipine 10 mg daily.  I explained that this can cause LE swelling. -His blood pressure is excellent.  For now, we will continue the same dose of amlodipine and will discuss with PCP whether he can reduce the dose of amlodipine to improve the edema.  Contact:  Beverlee Nims (daughter) - in [facility]: [phone]   Philemon Kingdom, MD PhD [facility]

## 2018-10-02 ENCOUNTER — Encounter: Payer: Self-pay | Admitting: Podiatry

## 2018-10-02 NOTE — Progress Notes (Signed)
Subjective: Patrick CromeHyun Kyu Hernandez presents to clinic today for preventative diabetic foot care.  He has history of diabetes, diabetic neuropathy with painful, discolored, thick toenails.  He also has painful callus and corns which put him at risk for developing wounds.    Objective: Vascular Examination: Capillary refill time <3 seconds x 10 digits Dorsalis pedis and Posterior tibial pulses present b/l No digital hair x 10 digits Skin temperature warm to cool b/l  Dermatological Examination: Skin with normal turgor, texture and tone b/l Toenails 1-5 b/l discolored, thick, dystrophic with subungual debris and pain with palpation to nailbeds due to thickness of nails. Hyperkeratotic lesion lateral nail borders fifth digit left foot consistent with listers corn formation.  Musculoskeletal: Muscle strength 5/5 to all LE muscle groups  Neurological: Sensation diminished with 10 gram monofilament. Vibratory sensation diminished  Assessment: 1. Painful onychomycosis toenails 1-5 b/l 2. Listers corn left fifth digit 3. NIDDM with Diabetic neuropathy  Plan: 1. Continue diabetic foot care principles.  Literature dispensed on today 2. Toenails 1-5 b/l were debrided in length and girth without iatrogenic bleeding. 3. Hyperkeratotic lesion pared with sterile chisel blade left fifth digit.  Silicone toe Dispensed to patient on today.  He is to apply it every day to his left fifth toe before putting on his socks and shoes.  He is to remove every evening.  This pad is reusable and is washable.  He related understanding. 4. Patient to continue soft, supportive shoe gear 5. Patient to report any pedal injuries to medical professional  6. Follow up 3 months. Patient/POA to call should there be a concern in the interim.

## 2018-10-14 ENCOUNTER — Other Ambulatory Visit (INDEPENDENT_AMBULATORY_CARE_PROVIDER_SITE_OTHER): Payer: BLUE CROSS/BLUE SHIELD

## 2018-10-14 DIAGNOSIS — E1169 Type 2 diabetes mellitus with other specified complication: Secondary | ICD-10-CM

## 2018-10-14 DIAGNOSIS — E1159 Type 2 diabetes mellitus with other circulatory complications: Secondary | ICD-10-CM | POA: Diagnosis not present

## 2018-10-14 DIAGNOSIS — I152 Hypertension secondary to endocrine disorders: Secondary | ICD-10-CM

## 2018-10-14 DIAGNOSIS — N183 Chronic kidney disease, stage 3 unspecified: Secondary | ICD-10-CM

## 2018-10-14 DIAGNOSIS — E785 Hyperlipidemia, unspecified: Secondary | ICD-10-CM | POA: Diagnosis not present

## 2018-10-14 DIAGNOSIS — I10 Essential (primary) hypertension: Secondary | ICD-10-CM

## 2018-10-14 LAB — BASIC METABOLIC PANEL
BUN: 30 mg/dL — AB (ref 6–23)
CO2: 28 mEq/L (ref 19–32)
Calcium: 8.9 mg/dL (ref 8.4–10.5)
Chloride: 102 mEq/L (ref 96–112)
Creatinine, Ser: 1.63 mg/dL — ABNORMAL HIGH (ref 0.40–1.50)
GFR: 45.81 mL/min — ABNORMAL LOW (ref >60.00)
GLUCOSE: 132 mg/dL — AB (ref 70–99)
Potassium: 5 mEq/L (ref 3.5–5.1)
SODIUM: 137 meq/L (ref 135–145)

## 2018-10-14 LAB — LIPID PANEL
Cholesterol: 118 mg/dL (ref 0–200)
HDL: 34.4 mg/dL — AB (ref >39.00)
LDL Cholesterol: 47 mg/dL (ref 0–99)
NonHDL: 83.12
Total CHOL/HDL Ratio: 3
Triglycerides: 180 mg/dL — ABNORMAL HIGH (ref 0.0–149.0)
VLDL: 36 mg/dL (ref 0.0–40.0)

## 2018-10-14 LAB — CBC
HCT: 40.8 % (ref 39.0–52.0)
Hemoglobin: 13.8 g/dL (ref 13.0–17.0)
MCHC: 33.9 g/dL (ref 30.0–36.0)
MCV: 89.2 fl (ref 78.0–100.0)
Platelets: 277 10*3/uL (ref 150.0–400.0)
RBC: 4.58 Mil/uL (ref 4.22–5.81)
RDW: 12.9 % (ref 11.5–15.5)
WBC: 8.7 10*3/uL (ref 4.0–10.5)

## 2018-10-14 LAB — HEMOGLOBIN A1C: Hgb A1c MFr Bld: 6.6 % — ABNORMAL HIGH (ref 4.6–6.5)

## 2018-11-16 ENCOUNTER — Other Ambulatory Visit: Payer: Self-pay

## 2018-11-16 DIAGNOSIS — E1149 Type 2 diabetes mellitus with other diabetic neurological complication: Secondary | ICD-10-CM

## 2018-11-16 MED ORDER — INSULIN GLARGINE 100 UNIT/ML SOLOSTAR PEN: PEN_INJECTOR | SUBCUTANEOUS | 11 refills | Status: AC

## 2018-11-17 ENCOUNTER — Telehealth: Payer: Self-pay | Admitting: *Deleted

## 2018-11-17 NOTE — Telephone Encounter (Signed)
Copied from CRM [id number]. Topic: General - Other >> Nov 17, 2018 10:19 AM Marylen Ponto wrote: Reason for CRM: Pt daughter Patrick Hernandez stated her father's new insurance plan does not include Dr. Selena Batten as being in network so a continuity of care form has to be completed. Pt daughter requests that a nurse call her back to discuss pt care and to help her answer some of the health questions on the form. Pt daughter also requested to speak with the office coordinator that handles insurance as she has some questions regarding the out of network costs if the continuity of care is not approved and pt decides to continue to see Dr. Selena Batten. Pt daughter requests a call back. Cb# [phone] >> Nov 17, 2018 11:22 AM Cox, Armando Gang, CMA wrote: Spoke with pt's daughter, Patrick Hernandez (DPR), and she advises pt has new [employer] via [employer] that is contracted with [facility]. They have been advised it is possible to ask for continued care with current providers if they have continuity of care documents completed. Pt will be dropping these forms off next week for completion. Advised daughter they may also want to have forms completed by Elvera Lennox and Berline Chough as pt sees them for care also. She will contact their offices.   JoAnne - FYI. Thanks!

## 2018-11-22 ENCOUNTER — Telehealth: Payer: Self-pay | Admitting: Family Medicine

## 2018-11-22 NOTE — Telephone Encounter (Signed)
Pts daughter dropped off a BCBS of  Continuity of Care Form to be filled out by the PCP.  Upon completion pt would like for it to be faxed to [phone] [facility] [place] Attn: [employer].  Form was put in providers folder for completion.

## 2018-11-23 NOTE — Telephone Encounter (Signed)
Pt's daughter returned call, advised per note in chart. Daughter says that she would like to have help with completing the form. Daughter is requesting a call back from CMA.

## 2018-11-23 NOTE — Telephone Encounter (Signed)
I left a detailed message at the pts home number with the information below.  The forms were placed at the front desk for the pt to pick up.

## 2018-11-23 NOTE — Telephone Encounter (Signed)
Look like this only has section for pt to complete. placed back in Jo Anne's box. Thanks.

## 2018-12-14 ENCOUNTER — Ambulatory Visit: Payer: BLUE CROSS/BLUE SHIELD | Admitting: Podiatry

## 2018-12-14 ENCOUNTER — Other Ambulatory Visit: Payer: Self-pay | Admitting: Family Medicine

## 2018-12-22 ENCOUNTER — Other Ambulatory Visit: Payer: Self-pay | Admitting: Internal Medicine

## 2019-01-06 ENCOUNTER — Encounter: Payer: BLUE CROSS/BLUE SHIELD | Admitting: Family Medicine

## 2019-01-11 ENCOUNTER — Other Ambulatory Visit: Payer: Self-pay | Admitting: Family Medicine

## 2019-01-14 ENCOUNTER — Other Ambulatory Visit: Payer: Self-pay | Admitting: Family Medicine

## 2019-02-04 ENCOUNTER — Ambulatory Visit: Payer: BLUE CROSS/BLUE SHIELD | Admitting: Internal Medicine

## 2019-02-17 ENCOUNTER — Other Ambulatory Visit: Payer: Self-pay | Admitting: Family Medicine

## 2019-03-18 ENCOUNTER — Other Ambulatory Visit: Payer: Self-pay | Admitting: Internal Medicine

## 2019-04-07 ENCOUNTER — Other Ambulatory Visit: Payer: Self-pay | Admitting: Family Medicine

## 2019-05-17 ENCOUNTER — Other Ambulatory Visit: Payer: Self-pay | Admitting: Family Medicine

## 2019-06-03 ENCOUNTER — Other Ambulatory Visit: Payer: Self-pay | Admitting: Family Medicine

## 2019-06-10 ENCOUNTER — Other Ambulatory Visit: Payer: Self-pay | Admitting: Family Medicine

## 2019-06-18 ENCOUNTER — Other Ambulatory Visit: Payer: Self-pay | Admitting: Internal Medicine

## 2019-07-04 ENCOUNTER — Other Ambulatory Visit: Payer: Self-pay | Admitting: Family Medicine

## 2019-07-05 ENCOUNTER — Other Ambulatory Visit: Payer: Self-pay | Admitting: Family Medicine

## 2019-07-28 ENCOUNTER — Other Ambulatory Visit: Payer: Self-pay | Admitting: Family Medicine

## 2019-08-24 ENCOUNTER — Other Ambulatory Visit: Payer: Self-pay | Admitting: Family Medicine

## 2019-09-14 ENCOUNTER — Other Ambulatory Visit: Payer: Self-pay | Admitting: Internal Medicine

## 2019-09-14 DIAGNOSIS — E1149 Type 2 diabetes mellitus with other diabetic neurological complication: Secondary | ICD-10-CM

## 2019-09-15 ENCOUNTER — Other Ambulatory Visit: Payer: Self-pay | Admitting: Family Medicine

## 2019-09-17 ENCOUNTER — Other Ambulatory Visit: Payer: Self-pay | Admitting: Internal Medicine

## 2019-12-06 ENCOUNTER — Other Ambulatory Visit: Payer: Self-pay | Admitting: Family Medicine

## 2019-12-16 ENCOUNTER — Other Ambulatory Visit: Payer: Self-pay | Admitting: Internal Medicine

## 2019-12-16 NOTE — Telephone Encounter (Signed)
Last office visit 10/01/2018  Cancel/No-show? 1  Future office visit scheduled? no  Please advise on refill.

## 2019-12-16 NOTE — Telephone Encounter (Signed)
Routed to wrong pools

## 2019-12-20 NOTE — Telephone Encounter (Signed)
Please schedule virtual follow up visit. Ok to refill in interim once scheduled.

## 2019-12-20 NOTE — Telephone Encounter (Signed)
Spoke with the patients daughter and informed her of the message below.  She stated this was sent in error to our office as the pt has a another PCP and she will contact their office for refills on test strips.

## 2020-08-18 ENCOUNTER — Other Ambulatory Visit: Payer: Self-pay | Admitting: Internal Medicine

## 2020-11-20 ENCOUNTER — Other Ambulatory Visit: Payer: Self-pay | Admitting: Internal Medicine

## 2021-08-08 NOTE — Telephone Encounter (Signed)
Error

## 2022-09-08 ENCOUNTER — Ambulatory Visit: Payer: BLUE CROSS/BLUE SHIELD

## 2022-09-08 DIAGNOSIS — Z23 Encounter for immunization: Secondary | ICD-10-CM

## 2023-11-24 DIAGNOSIS — Z794 Long term (current) use of insulin: Secondary | ICD-10-CM | POA: Diagnosis not present

## 2023-11-24 DIAGNOSIS — E1142 Type 2 diabetes mellitus with diabetic polyneuropathy: Secondary | ICD-10-CM | POA: Diagnosis not present

## 2023-12-31 DIAGNOSIS — E11319 Type 2 diabetes mellitus with unspecified diabetic retinopathy without macular edema: Secondary | ICD-10-CM | POA: Diagnosis not present

## 2023-12-31 DIAGNOSIS — N189 Chronic kidney disease, unspecified: Secondary | ICD-10-CM | POA: Diagnosis not present

## 2023-12-31 DIAGNOSIS — I129 Hypertensive chronic kidney disease with stage 1 through stage 4 chronic kidney disease, or unspecified chronic kidney disease: Secondary | ICD-10-CM | POA: Diagnosis not present

## 2023-12-31 DIAGNOSIS — E1140 Type 2 diabetes mellitus with diabetic neuropathy, unspecified: Secondary | ICD-10-CM | POA: Diagnosis not present

## 2023-12-31 DIAGNOSIS — E1122 Type 2 diabetes mellitus with diabetic chronic kidney disease: Secondary | ICD-10-CM | POA: Diagnosis not present
